# Patient Record
Sex: Female | Born: 1945 | Race: White | Hispanic: No | Smoking: Former smoker
Health system: Southern US, Community
[De-identification: ages and names within clinical notes are randomized; demographics above are authoritative.]

## PROBLEM LIST (undated history)

## (undated) DIAGNOSIS — G709 Myoneural disorder, unspecified: Secondary | ICD-10-CM

## (undated) DIAGNOSIS — N189 Chronic kidney disease, unspecified: Secondary | ICD-10-CM

## (undated) DIAGNOSIS — IMO0001 Reserved for inherently not codable concepts without codable children: Secondary | ICD-10-CM

## (undated) DIAGNOSIS — L409 Psoriasis, unspecified: Secondary | ICD-10-CM

## (undated) DIAGNOSIS — Z8489 Family history of other specified conditions: Secondary | ICD-10-CM

## (undated) DIAGNOSIS — R011 Cardiac murmur, unspecified: Secondary | ICD-10-CM

## (undated) DIAGNOSIS — J302 Other seasonal allergic rhinitis: Secondary | ICD-10-CM

## (undated) DIAGNOSIS — I10 Essential (primary) hypertension: Secondary | ICD-10-CM

## (undated) DIAGNOSIS — K219 Gastro-esophageal reflux disease without esophagitis: Secondary | ICD-10-CM

## (undated) DIAGNOSIS — I639 Cerebral infarction, unspecified: Secondary | ICD-10-CM

## (undated) DIAGNOSIS — T4145XA Adverse effect of unspecified anesthetic, initial encounter: Secondary | ICD-10-CM

## (undated) DIAGNOSIS — T8859XA Other complications of anesthesia, initial encounter: Secondary | ICD-10-CM

## (undated) DIAGNOSIS — M1990 Unspecified osteoarthritis, unspecified site: Secondary | ICD-10-CM

## (undated) DIAGNOSIS — C801 Malignant (primary) neoplasm, unspecified: Secondary | ICD-10-CM

## (undated) DIAGNOSIS — Z87442 Personal history of urinary calculi: Secondary | ICD-10-CM

## (undated) DIAGNOSIS — M797 Fibromyalgia: Secondary | ICD-10-CM

## (undated) DIAGNOSIS — Z5189 Encounter for other specified aftercare: Secondary | ICD-10-CM

## (undated) HISTORY — PX: EYE SURGERY: SHX253

## (undated) HISTORY — PX: JOINT REPLACEMENT: SHX530

## (undated) HISTORY — PX: CYST EXCISION: SHX5701

## (undated) HISTORY — PX: OVARIAN CYST REMOVAL: SHX89

## (undated) HISTORY — PX: CERVICAL FUSION: SHX112

## (undated) HISTORY — PX: TONSILLECTOMY: SUR1361

## (undated) HISTORY — PX: TRIGGER FINGER RELEASE: SHX641

## (undated) HISTORY — PX: COLON SURGERY: SHX602

## (undated) HISTORY — PX: ABDOMINAL HYSTERECTOMY: SHX81

## (undated) HISTORY — PX: APPENDECTOMY: SHX54

---

## 2003-11-04 ENCOUNTER — Other Ambulatory Visit: Payer: Self-pay

## 2003-11-07 ENCOUNTER — Inpatient Hospital Stay (HOSPITAL_COMMUNITY)
Admission: RE | Admit: 2003-11-07 | Discharge: 2003-11-15 | Payer: Self-pay | Admitting: Physical Medicine & Rehabilitation

## 2003-12-23 ENCOUNTER — Encounter
Admission: RE | Admit: 2003-12-23 | Discharge: 2004-03-22 | Payer: Self-pay | Admitting: Physical Medicine & Rehabilitation

## 2004-07-08 ENCOUNTER — Ambulatory Visit: Payer: Self-pay | Admitting: Anesthesiology

## 2004-07-30 ENCOUNTER — Ambulatory Visit: Payer: Self-pay | Admitting: Anesthesiology

## 2004-08-31 ENCOUNTER — Ambulatory Visit: Payer: Self-pay | Admitting: Anesthesiology

## 2004-10-19 ENCOUNTER — Ambulatory Visit: Payer: Self-pay | Admitting: Anesthesiology

## 2004-11-10 ENCOUNTER — Ambulatory Visit: Payer: Self-pay | Admitting: Anesthesiology

## 2004-12-07 ENCOUNTER — Ambulatory Visit: Payer: Self-pay | Admitting: Anesthesiology

## 2004-12-30 ENCOUNTER — Ambulatory Visit: Payer: Self-pay | Admitting: Anesthesiology

## 2005-01-19 ENCOUNTER — Ambulatory Visit: Payer: Self-pay | Admitting: Internal Medicine

## 2005-01-28 ENCOUNTER — Ambulatory Visit: Payer: Self-pay | Admitting: Anesthesiology

## 2006-02-24 ENCOUNTER — Ambulatory Visit: Payer: Self-pay | Admitting: Anesthesiology

## 2006-03-31 ENCOUNTER — Ambulatory Visit: Payer: Self-pay | Admitting: Anesthesiology

## 2006-06-01 ENCOUNTER — Ambulatory Visit: Payer: Self-pay | Admitting: Anesthesiology

## 2006-07-11 ENCOUNTER — Ambulatory Visit: Payer: Self-pay | Admitting: Anesthesiology

## 2006-10-27 ENCOUNTER — Ambulatory Visit: Payer: Self-pay | Admitting: Anesthesiology

## 2006-12-15 ENCOUNTER — Ambulatory Visit: Payer: Self-pay | Admitting: Anesthesiology

## 2006-12-22 ENCOUNTER — Ambulatory Visit: Payer: Self-pay | Admitting: Internal Medicine

## 2007-02-09 ENCOUNTER — Ambulatory Visit: Payer: Self-pay | Admitting: Anesthesiology

## 2007-03-14 ENCOUNTER — Ambulatory Visit: Payer: Self-pay | Admitting: Anesthesiology

## 2007-04-18 ENCOUNTER — Ambulatory Visit: Payer: Self-pay | Admitting: Anesthesiology

## 2007-06-27 ENCOUNTER — Ambulatory Visit: Payer: Self-pay | Admitting: Anesthesiology

## 2007-08-28 ENCOUNTER — Ambulatory Visit: Payer: Self-pay | Admitting: Anesthesiology

## 2007-10-20 ENCOUNTER — Ambulatory Visit: Payer: Self-pay | Admitting: Anesthesiology

## 2008-01-10 ENCOUNTER — Ambulatory Visit: Payer: Self-pay | Admitting: Anesthesiology

## 2008-01-17 ENCOUNTER — Ambulatory Visit: Payer: Self-pay | Admitting: Pain Medicine

## 2008-01-31 ENCOUNTER — Ambulatory Visit: Payer: Self-pay | Admitting: Pain Medicine

## 2008-02-08 ENCOUNTER — Ambulatory Visit: Payer: Self-pay | Admitting: Internal Medicine

## 2008-02-26 ENCOUNTER — Encounter: Payer: Self-pay | Admitting: Pain Medicine

## 2008-02-28 ENCOUNTER — Ambulatory Visit: Payer: Self-pay | Admitting: Pain Medicine

## 2008-03-04 ENCOUNTER — Encounter: Payer: Self-pay | Admitting: Pain Medicine

## 2008-03-05 ENCOUNTER — Ambulatory Visit: Payer: Self-pay | Admitting: Pain Medicine

## 2008-03-13 ENCOUNTER — Ambulatory Visit: Payer: Self-pay | Admitting: Pain Medicine

## 2008-04-03 ENCOUNTER — Encounter: Payer: Self-pay | Admitting: Pain Medicine

## 2008-04-03 ENCOUNTER — Ambulatory Visit: Payer: Self-pay | Admitting: Pain Medicine

## 2008-05-04 ENCOUNTER — Ambulatory Visit: Payer: Self-pay | Admitting: Pain Medicine

## 2008-07-24 ENCOUNTER — Ambulatory Visit: Payer: Self-pay | Admitting: Physician Assistant

## 2008-10-17 ENCOUNTER — Ambulatory Visit: Payer: Self-pay | Admitting: Physician Assistant

## 2009-02-04 ENCOUNTER — Ambulatory Visit: Payer: Self-pay | Admitting: Physician Assistant

## 2009-02-10 ENCOUNTER — Ambulatory Visit: Payer: Self-pay | Admitting: Internal Medicine

## 2009-05-06 ENCOUNTER — Ambulatory Visit: Payer: Self-pay | Admitting: Physician Assistant

## 2009-06-04 ENCOUNTER — Ambulatory Visit: Payer: Self-pay | Admitting: Physician Assistant

## 2009-06-24 ENCOUNTER — Ambulatory Visit: Payer: Self-pay | Admitting: Pain Medicine

## 2009-07-10 ENCOUNTER — Ambulatory Visit: Payer: Self-pay | Admitting: Physician Assistant

## 2009-10-02 ENCOUNTER — Ambulatory Visit: Payer: Self-pay | Admitting: Physician Assistant

## 2009-10-20 ENCOUNTER — Ambulatory Visit: Payer: Self-pay | Admitting: Pain Medicine

## 2009-10-21 ENCOUNTER — Ambulatory Visit: Payer: Self-pay | Admitting: Pain Medicine

## 2009-11-12 ENCOUNTER — Ambulatory Visit: Payer: Self-pay | Admitting: Pain Medicine

## 2009-11-25 ENCOUNTER — Ambulatory Visit: Payer: Self-pay | Admitting: Pain Medicine

## 2010-02-11 ENCOUNTER — Ambulatory Visit: Payer: Self-pay | Admitting: Internal Medicine

## 2010-05-14 ENCOUNTER — Ambulatory Visit: Payer: Self-pay

## 2010-05-18 ENCOUNTER — Ambulatory Visit: Payer: Self-pay | Admitting: Pain Medicine

## 2011-02-15 ENCOUNTER — Ambulatory Visit: Payer: Self-pay | Admitting: Internal Medicine

## 2011-08-17 ENCOUNTER — Encounter (HOSPITAL_COMMUNITY): Payer: Self-pay | Admitting: Pharmacy Technician

## 2011-08-18 ENCOUNTER — Encounter (HOSPITAL_COMMUNITY)
Admission: RE | Admit: 2011-08-18 | Discharge: 2011-08-18 | Disposition: A | Discharge status: Home / Self Care | Payer: Medicare Other | Source: Ambulatory Visit | Attending: Orthopedic Surgery | Admitting: Orthopedic Surgery

## 2011-08-18 ENCOUNTER — Encounter (HOSPITAL_COMMUNITY): Payer: Self-pay

## 2011-08-18 ENCOUNTER — Encounter (HOSPITAL_COMMUNITY)
Admission: RE | Admit: 2011-08-18 | Discharge: 2011-08-18 | Disposition: A | Discharge status: Home / Self Care | Payer: Medicare Other | Source: Ambulatory Visit | Attending: Anesthesiology | Admitting: Anesthesiology

## 2011-08-18 HISTORY — DX: Fibromyalgia: M79.7

## 2011-08-18 HISTORY — DX: Myoneural disorder, unspecified: G70.9

## 2011-08-18 HISTORY — DX: Adverse effect of unspecified anesthetic, initial encounter: T41.45XA

## 2011-08-18 HISTORY — DX: Cardiac murmur, unspecified: R01.1

## 2011-08-18 HISTORY — DX: Essential (primary) hypertension: I10

## 2011-08-18 HISTORY — DX: Chronic kidney disease, unspecified: N18.9

## 2011-08-18 HISTORY — DX: Encounter for other specified aftercare: Z51.89

## 2011-08-18 HISTORY — DX: Other complications of anesthesia, initial encounter: T88.59XA

## 2011-08-18 HISTORY — DX: Reserved for inherently not codable concepts without codable children: IMO0001

## 2011-08-18 HISTORY — DX: Unspecified osteoarthritis, unspecified site: M19.90

## 2011-08-18 HISTORY — DX: Cerebral infarction, unspecified: I63.9

## 2011-08-18 LAB — SURGICAL PCR SCREEN
MRSA, PCR: NEGATIVE
Staphylococcus aureus: NEGATIVE

## 2011-08-18 LAB — CBC
HCT: 38.9 % (ref 36.0–46.0)
Hemoglobin: 13.4 g/dL (ref 12.0–15.0)
MCH: 32.3 pg (ref 26.0–34.0)
MCHC: 34.4 g/dL (ref 30.0–36.0)
MCV: 93.7 fL (ref 78.0–100.0)
Platelets: 323 10*3/uL (ref 150–400)
RBC: 4.15 MIL/uL (ref 3.87–5.11)
RDW: 13.6 % (ref 11.5–15.5)
WBC: 15.1 10*3/uL — ABNORMAL HIGH (ref 4.0–10.5)

## 2011-08-18 LAB — BASIC METABOLIC PANEL
BUN: 22 mg/dL (ref 6–23)
CO2: 27 mEq/L (ref 19–32)
Calcium: 10.3 mg/dL (ref 8.4–10.5)
Chloride: 97 mEq/L (ref 96–112)
Creatinine, Ser: 0.66 mg/dL (ref 0.50–1.10)
GFR calc Af Amer: >90 mL/min (ref >90)
GFR calc non Af Amer: >90 mL/min (ref >90)
Glucose, Bld: 95 mg/dL (ref 70–99)
Potassium: 5.5 mEq/L — ABNORMAL HIGH (ref 3.5–5.1)
Sodium: 136 mEq/L (ref 135–145)

## 2011-08-18 NOTE — Progress Notes (Signed)
Spoke with Ceserine in [employer] she has been informed about LATEX allergy

## 2011-08-18 NOTE — Progress Notes (Signed)
Call to Dr. Shon Baton office, for preop orders

## 2011-08-18 NOTE — Progress Notes (Signed)
Call to [facility] for last ekg & ov note

## 2011-08-18 NOTE — Pre-Procedure Instructions (Signed)
20 Annette Mccullough  08/18/2011   Your procedure is scheduled on:  08/25/2011  Report to [facility] at 6:30 AM.  Call this number if you have problems the morning of surgery: [phone]   Remember:   Do not eat food:After Midnight.  Do not drink clear liquids: 4 Hours before arrival.  Take these medicines the morning of surgery with A SIP OF WATER: cymbalta, cyclobenzaprine, pain medicine if needed   Do not wear jewelry, make-up or nail polish.  Do not wear lotions, powders, or perfumes. You may wear deodorant.  Do not shave 48 hours prior to surgery.  Do not bring valuables to the hospital.  Contacts, dentures or bridgework may not be worn into surgery.  Leave suitcase in the car. After surgery it may be brought to your room.  For patients admitted to the hospital, checkout time is 11:00 AM the day of discharge.   Patients discharged the day of surgery will not be allowed to drive home.  Name and phone number of your driver:   Special Instructions: CHG Shower Use Special Wash: 1/2 bottle night before surgery and 1/2 bottle morning of surgery.   Please read over the following fact sheets that you were given: Pain Booklet, Coughing and Deep Breathing, MRSA Information and Surgical Site Infection Prevention

## 2011-08-24 MED ORDER — CEFAZOLIN SODIUM-DEXTROSE 2-3 GM-% IV SOLR
2.0000 g | INTRAVENOUS | Status: AC
  Administered 2011-08-25: 2 g via INTRAVENOUS
  Filled 2011-08-24: qty 50

## 2011-08-25 ENCOUNTER — Ambulatory Visit (HOSPITAL_COMMUNITY): Payer: Medicare Other

## 2011-08-25 ENCOUNTER — Ambulatory Visit (HOSPITAL_COMMUNITY)
Admission: RE | Admit: 2011-08-25 | Discharge: 2011-08-26 | DRG: 030 | Disposition: A | Discharge status: Home / Self Care | Payer: Medicare Other | Source: Ambulatory Visit | Attending: Orthopedic Surgery | Admitting: Orthopedic Surgery

## 2011-08-25 ENCOUNTER — Encounter (HOSPITAL_COMMUNITY)
Admission: RE | Disposition: A | Discharge status: Home / Self Care | Payer: Self-pay | Source: Ambulatory Visit | Attending: Orthopedic Surgery

## 2011-08-25 ENCOUNTER — Ambulatory Visit (HOSPITAL_COMMUNITY): Payer: Medicare Other | Admitting: Anesthesiology

## 2011-08-25 ENCOUNTER — Encounter (HOSPITAL_COMMUNITY): Payer: Self-pay | Admitting: Orthopedic Surgery

## 2011-08-25 ENCOUNTER — Encounter (HOSPITAL_COMMUNITY): Payer: Self-pay | Admitting: Anesthesiology

## 2011-08-25 DIAGNOSIS — Z01812 Encounter for preprocedural laboratory examination: Secondary | ICD-10-CM | POA: Insufficient documentation

## 2011-08-25 DIAGNOSIS — Z8673 Personal history of transient ischemic attack (TIA), and cerebral infarction without residual deficits: Secondary | ICD-10-CM | POA: Insufficient documentation

## 2011-08-25 DIAGNOSIS — G8929 Other chronic pain: Secondary | ICD-10-CM | POA: Diagnosis present

## 2011-08-25 DIAGNOSIS — I10 Essential (primary) hypertension: Secondary | ICD-10-CM | POA: Diagnosis present

## 2011-08-25 DIAGNOSIS — M79609 Pain in unspecified limb: Secondary | ICD-10-CM | POA: Insufficient documentation

## 2011-08-25 DIAGNOSIS — Z01818 Encounter for other preprocedural examination: Secondary | ICD-10-CM | POA: Insufficient documentation

## 2011-08-25 HISTORY — PX: SPINAL CORD STIMULATOR INSERTION: SHX5378

## 2011-08-25 LAB — URINALYSIS, MICROSCOPIC ONLY
Bilirubin Urine: NEGATIVE
Glucose, UA: NEGATIVE mg/dL
Hgb urine dipstick: NEGATIVE
Ketones, ur: NEGATIVE mg/dL
Leukocytes, UA: NEGATIVE
Nitrite: NEGATIVE
Protein, ur: NEGATIVE mg/dL
Specific Gravity, Urine: 1.027 (ref 1.005–1.030)
Urobilinogen, UA: 0.2 mg/dL (ref 0.0–1.0)
pH: 5.5 (ref 5.0–8.0)

## 2011-08-25 LAB — POCT I-STAT 4, (NA,K, GLUC, HGB,HCT)
Glucose, Bld: 98 mg/dL (ref 70–99)
HCT: 38 % (ref 36.0–46.0)
Hemoglobin: 12.9 g/dL (ref 12.0–15.0)
Potassium: 3.6 mEq/L (ref 3.5–5.1)
Sodium: 141 mEq/L (ref 135–145)

## 2011-08-25 LAB — GLUCOSE, CAPILLARY: Glucose-Capillary: 100 mg/dL — ABNORMAL HIGH (ref 70–99)

## 2011-08-25 SURGERY — INSERTION, SPINAL CORD STIMULATOR, LUMBAR
Anesthesia: General | Site: Back | Wound class: Clean

## 2011-08-25 MED ORDER — ACETAMINOPHEN 10 MG/ML IV SOLN
INTRAVENOUS | Status: AC
  Filled 2011-08-25: qty 100

## 2011-08-25 MED ORDER — ENALAPRIL MALEATE 20 MG PO TABS
20.0000 mg | ORAL_TABLET | Freq: Two times a day (BID) | ORAL | Status: DC
  Administered 2011-08-25 – 2011-08-26 (×2): 20 mg via ORAL
  Filled 2011-08-25 (×3): qty 1

## 2011-08-25 MED ORDER — DEXAMETHASONE 4 MG PO TABS
4.0000 mg | ORAL_TABLET | Freq: Four times a day (QID) | ORAL | Status: DC
  Administered 2011-08-26: 4 mg via ORAL
  Filled 2011-08-25 (×7): qty 1

## 2011-08-25 MED ORDER — METOPROLOL TARTRATE 25 MG PO TABS
25.0000 mg | ORAL_TABLET | Freq: Every day | ORAL | Status: DC
  Administered 2011-08-25 – 2011-08-26 (×2): 25 mg via ORAL
  Filled 2011-08-25 (×2): qty 1

## 2011-08-25 MED ORDER — DEXAMETHASONE SODIUM PHOSPHATE 4 MG/ML IJ SOLN
4.0000 mg | Freq: Four times a day (QID) | INTRAMUSCULAR | Status: DC
  Administered 2011-08-25 – 2011-08-26 (×3): 4 mg via INTRAVENOUS
  Filled 2011-08-25 (×7): qty 1

## 2011-08-25 MED ORDER — METOPROLOL TARTRATE 50 MG PO TABS
ORAL_TABLET | ORAL | Status: AC
  Administered 2011-08-25: 25 mg via ORAL
  Filled 2011-08-25: qty 1

## 2011-08-25 MED ORDER — MAGNESIUM HYDROXIDE 400 MG/5ML PO SUSP: 30.0000 mL | Freq: Two times a day (BID) | ORAL | Status: DC | PRN

## 2011-08-25 MED ORDER — ONDANSETRON HCL 4 MG/2ML IJ SOLN: 4.0000 mg | INTRAMUSCULAR | Status: DC | PRN

## 2011-08-25 MED ORDER — MENTHOL 3 MG MT LOZG: 1.0000 | LOZENGE | OROMUCOSAL | Status: DC | PRN

## 2011-08-25 MED ORDER — HYDROMORPHONE HCL PF 1 MG/ML IJ SOLN
0.2500 mg | INTRAMUSCULAR | Status: DC | PRN
  Administered 2011-08-25 (×3): 0.5 mg via INTRAVENOUS

## 2011-08-25 MED ORDER — CEFAZOLIN SODIUM 1-5 GM-% IV SOLN
1.0000 g | Freq: Three times a day (TID) | INTRAVENOUS | Status: AC
  Administered 2011-08-25 – 2011-08-26 (×2): 1 g via INTRAVENOUS
  Filled 2011-08-25 (×2): qty 50

## 2011-08-25 MED ORDER — LACTATED RINGERS IV SOLN
INTRAVENOUS | Status: DC
  Administered 2011-08-25: 16:00:00 via INTRAVENOUS

## 2011-08-25 MED ORDER — NEOSTIGMINE METHYLSULFATE 1 MG/ML IJ SOLN
INTRAMUSCULAR | Status: DC | PRN
  Administered 2011-08-25: 5 mg via INTRAVENOUS

## 2011-08-25 MED ORDER — SODIUM CHLORIDE 0.9 % IJ SOLN: 3.0000 mL | INTRAMUSCULAR | Status: DC | PRN

## 2011-08-25 MED ORDER — FENTANYL CITRATE 0.05 MG/ML IJ SOLN
INTRAMUSCULAR | Status: DC | PRN
  Administered 2011-08-25 (×3): 50 ug via INTRAVENOUS
  Administered 2011-08-25: 150 ug via INTRAVENOUS
  Administered 2011-08-25: 50 ug via INTRAVENOUS

## 2011-08-25 MED ORDER — HYDROCHLOROTHIAZIDE 25 MG PO TABS
25.0000 mg | ORAL_TABLET | Freq: Every day | ORAL | Status: DC
  Administered 2011-08-26: 25 mg via ORAL
  Filled 2011-08-25 (×2): qty 1

## 2011-08-25 MED ORDER — ONDANSETRON HCL 4 MG/2ML IJ SOLN
INTRAMUSCULAR | Status: DC | PRN
  Administered 2011-08-25: 4 mg via INTRAVENOUS

## 2011-08-25 MED ORDER — HYDROMORPHONE HCL PF 1 MG/ML IJ SOLN
INTRAMUSCULAR | Status: AC
  Filled 2011-08-25: qty 1

## 2011-08-25 MED ORDER — MORPHINE SULFATE 2 MG/ML IJ SOLN
1.0000 mg | INTRAMUSCULAR | Status: DC | PRN
  Administered 2011-08-25: 2 mg via INTRAVENOUS
  Filled 2011-08-25: qty 1

## 2011-08-25 MED ORDER — MIDAZOLAM HCL 5 MG/5ML IJ SOLN
INTRAMUSCULAR | Status: DC | PRN
  Administered 2011-08-25 (×2): 1 mg via INTRAVENOUS

## 2011-08-25 MED ORDER — ONDANSETRON HCL 4 MG PO TABS: 4.0000 mg | ORAL_TABLET | Freq: Three times a day (TID) | ORAL | Status: AC | PRN

## 2011-08-25 MED ORDER — PHENOL 1.4 % MT LIQD: 1.0000 | OROMUCOSAL | Status: DC | PRN

## 2011-08-25 MED ORDER — GLYCOPYRROLATE 0.2 MG/ML IJ SOLN
INTRAMUSCULAR | Status: DC | PRN
  Administered 2011-08-25: 1 mg via INTRAVENOUS

## 2011-08-25 MED ORDER — LACTATED RINGERS IV SOLN
INTRAVENOUS | Status: DC | PRN
  Administered 2011-08-25 (×3): via INTRAVENOUS

## 2011-08-25 MED ORDER — DULOXETINE HCL 60 MG PO CPEP
60.0000 mg | ORAL_CAPSULE | Freq: Every day | ORAL | Status: DC
  Administered 2011-08-26: 60 mg via ORAL
  Filled 2011-08-25 (×2): qty 1

## 2011-08-25 MED ORDER — PROMETHAZINE HCL 25 MG/ML IJ SOLN: 6.2500 mg | INTRAMUSCULAR | Status: DC | PRN

## 2011-08-25 MED ORDER — BUPIVACAINE-EPINEPHRINE 0.25% -1:200000 IJ SOLN
INTRAMUSCULAR | Status: DC | PRN
  Administered 2011-08-25: 10 mL

## 2011-08-25 MED ORDER — DOCUSATE SODIUM 100 MG PO CAPS
100.0000 mg | ORAL_CAPSULE | Freq: Two times a day (BID) | ORAL | Status: DC
  Administered 2011-08-25 – 2011-08-26 (×2): 100 mg via ORAL
  Filled 2011-08-25 (×3): qty 1

## 2011-08-25 MED ORDER — MEPERIDINE HCL 25 MG/ML IJ SOLN: 6.2500 mg | INTRAMUSCULAR | Status: DC | PRN

## 2011-08-25 MED ORDER — SODIUM CHLORIDE 0.9 % IJ SOLN
3.0000 mL | Freq: Two times a day (BID) | INTRAMUSCULAR | Status: DC
  Administered 2011-08-25: 3 mL via INTRAVENOUS

## 2011-08-25 MED ORDER — SURGIFOAM 100 EX MISC
CUTANEOUS | Status: DC | PRN
  Administered 2011-08-25: 10:00:00 via TOPICAL

## 2011-08-25 MED ORDER — ACETAMINOPHEN 10 MG/ML IV SOLN
1000.0000 mg | Freq: Once | INTRAVENOUS | Status: AC
  Administered 2011-08-25 (×2): 1000 mg via INTRAVENOUS
  Filled 2011-08-25: qty 100

## 2011-08-25 MED ORDER — POLYETHYLENE GLYCOL 3350 17 G PO PACK: 17.0000 g | PACK | Freq: Every day | ORAL | Status: AC

## 2011-08-25 MED ORDER — MORPHINE SULFATE 2 MG/ML IJ SOLN: 0.0500 mg/kg | INTRAMUSCULAR | Status: DC | PRN

## 2011-08-25 MED ORDER — POLYETHYLENE GLYCOL 3350 17 G PO PACK: 17.0000 g | PACK | Freq: Every day | ORAL | Status: DC | PRN

## 2011-08-25 MED ORDER — ACETAMINOPHEN 10 MG/ML IV SOLN
1000.0000 mg | Freq: Four times a day (QID) | INTRAVENOUS | Status: DC
  Administered 2011-08-25 – 2011-08-26 (×3): 1000 mg via INTRAVENOUS
  Filled 2011-08-25 (×4): qty 100

## 2011-08-25 MED ORDER — HETASTARCH-ELECTROLYTES 6 % IV SOLN
INTRAVENOUS | Status: DC | PRN
  Administered 2011-08-25: 10:00:00 via INTRAVENOUS

## 2011-08-25 MED ORDER — CYCLOBENZAPRINE HCL 10 MG PO TABS
10.0000 mg | ORAL_TABLET | Freq: Two times a day (BID) | ORAL | Status: DC | PRN
  Administered 2011-08-25: 10 mg via ORAL
  Filled 2011-08-25: qty 1

## 2011-08-25 MED ORDER — PROPOFOL 10 MG/ML IV EMUL
INTRAVENOUS | Status: DC | PRN
  Administered 2011-08-25: 180 mg via INTRAVENOUS

## 2011-08-25 MED ORDER — ROCURONIUM BROMIDE 100 MG/10ML IV SOLN
INTRAVENOUS | Status: DC | PRN
  Administered 2011-08-25: 50 mg via INTRAVENOUS
  Administered 2011-08-25 (×5): 10 mg via INTRAVENOUS

## 2011-08-25 MED ORDER — METOPROLOL TARTRATE 12.5 MG HALF TABLET: 25.0000 mg | ORAL_TABLET | Freq: Once | ORAL | Status: DC

## 2011-08-25 MED ORDER — MIDAZOLAM HCL 2 MG/2ML IJ SOLN: 0.5000 mg | Freq: Once | INTRAMUSCULAR | Status: DC | PRN

## 2011-08-25 MED ORDER — EPHEDRINE SULFATE 50 MG/ML IJ SOLN
INTRAMUSCULAR | Status: DC | PRN
  Administered 2011-08-25: 5 mg via INTRAVENOUS

## 2011-08-25 MED ORDER — SODIUM CHLORIDE 0.9 % IV SOLN: 250.0000 mL | INTRAVENOUS | Status: DC

## 2011-08-25 MED ORDER — ZOLPIDEM TARTRATE 10 MG PO TABS: 10.0000 mg | ORAL_TABLET | Freq: Every evening | ORAL | Status: DC | PRN

## 2011-08-25 MED ORDER — OXYCODONE HCL 5 MG PO TABS
10.0000 mg | ORAL_TABLET | ORAL | Status: DC | PRN
  Administered 2011-08-25 – 2011-08-26 (×3): 10 mg via ORAL
  Filled 2011-08-25 (×3): qty 2

## 2011-08-25 MED ORDER — CHLORHEXIDINE GLUCONATE 4 % EX LIQD: 60.0000 mL | Freq: Once | CUTANEOUS | Status: DC

## 2011-08-25 SURGICAL SUPPLY — 62 items
BAG ISOLATION DRAPE 18X18 (DRAPES) ×1 IMPLANT
CANISTER SUCTION 2500CC (MISCELLANEOUS) IMPLANT
CHANNEL EON MINI 16 IPG (Orthopedic Implant) ×2 IMPLANT
CLOSURE STERI STRIP 1/2 X4 (GAUZE/BANDAGES/DRESSINGS) ×2 IMPLANT
CLOTH BEACON ORANGE TIMEOUT ST (SAFETY) ×2 IMPLANT
CORDS BIPOLAR (ELECTRODE) ×2 IMPLANT
DERMABOND ADVANCED (GAUZE/BANDAGES/DRESSINGS)
DERMABOND ADVANCED .7 DNX12 (GAUZE/BANDAGES/DRESSINGS) IMPLANT
DRAPE C-ARM 42X72 X-RAY (DRAPES) ×2 IMPLANT
DRAPE INCISE IOBAN 85X60 (DRAPES) ×2 IMPLANT
DRAPE ISOLATION BAG 18X18 (DRAPES) ×1
DRAPE SURG 17X23 STRL (DRAPES) ×2 IMPLANT
DRAPE U-SHAPE 47X51 STRL (DRAPES) ×4 IMPLANT
DRSG MEPILEX BORDER 4X4 (GAUZE/BANDAGES/DRESSINGS) IMPLANT
DURAPREP 26ML APPLICATOR (WOUND CARE) ×2 IMPLANT
ELECT CAUTERY BLADE 6.4 (BLADE) ×2 IMPLANT
ELECT REM PT RETURN 9FT ADLT (ELECTROSURGICAL) ×2
ELECTRODE REM PT RTRN 9FT ADLT (ELECTROSURGICAL) ×1 IMPLANT
GAUZE SPONGE 4X4 12PLY STRL LF (GAUZE/BANDAGES/DRESSINGS) ×2 IMPLANT
GLOVE BIOGEL PI IND STRL 6.5 (GLOVE) ×1 IMPLANT
GLOVE BIOGEL PI IND STRL 8.5 (GLOVE) ×1 IMPLANT
GLOVE BIOGEL PI INDICATOR 6.5 (GLOVE) ×1
GLOVE BIOGEL PI INDICATOR 8.5 (GLOVE) ×1
GLOVE ECLIPSE 6.0 STRL STRAW (GLOVE) ×2 IMPLANT
GLOVE ECLIPSE 8.5 STRL (GLOVE) IMPLANT
GOWN PREVENTION PLUS XXLARGE (GOWN DISPOSABLE) ×4 IMPLANT
GOWN STRL NON-REIN LRG LVL3 (GOWN DISPOSABLE) ×4 IMPLANT
KIT BASIN OR (CUSTOM PROCEDURE TRAY) ×2 IMPLANT
KIT ROOM TURNOVER OR (KITS) ×2 IMPLANT
Lamitrode s-8 Lead kit, 60cm length ×4 IMPLANT
NDL SUT 6 .5 CRC .975X.05 MAYO (NEEDLE) ×1 IMPLANT
NEEDLE 22X1 1/2 (OR ONLY) (NEEDLE) ×2 IMPLANT
NEEDLE MAYO TAPER (NEEDLE) ×1
NEEDLE SPNL 18GX3.5 QUINCKE PK (NEEDLE) ×4 IMPLANT
NS IRRIG 1000ML POUR BTL (IV SOLUTION) ×2 IMPLANT
PACK LAMINECTOMY ORTHO (CUSTOM PROCEDURE TRAY) ×2 IMPLANT
PACK UNIVERSAL I (CUSTOM PROCEDURE TRAY) ×2 IMPLANT
PAD ARMBOARD 7.5X6 YLW CONV (MISCELLANEOUS) ×2 IMPLANT
PROGRAMMER EON PATIENT (MISCELLANEOUS) ×2 IMPLANT
SPATULA SILICONE BRAIN 10MM (Orthopedic Implant) ×2 IMPLANT
SPONGE GAUZE 4X4 12PLY (GAUZE/BANDAGES/DRESSINGS) ×2 IMPLANT
SPONGE LAP 4X18 X RAY DECT (DISPOSABLE) ×2 IMPLANT
SPONGE SURGIFOAM ABS GEL 100 (HEMOSTASIS) ×2 IMPLANT
STAPLER VISISTAT 35W (STAPLE) ×2 IMPLANT
STRIP CLOSURE SKIN 1/2X4 (GAUZE/BANDAGES/DRESSINGS) ×2 IMPLANT
SURGIFLO TRUKIT (HEMOSTASIS) ×4 IMPLANT
SUT FIBERWIRE #2 38 REV NDL BL (SUTURE) ×2
SUT FIBERWIRE 2-0 18 17.9 3/8 (SUTURE) ×2
SUT MNCRL AB 3-0 PS2 18 (SUTURE) ×4 IMPLANT
SUT VIC AB 1 CT1 27 (SUTURE) ×3
SUT VIC AB 1 CT1 27XBRD ANBCTR (SUTURE) ×3 IMPLANT
SUT VIC AB 2-0 CT1 18 (SUTURE) ×2 IMPLANT
SUTURE FIBERWR 2-0 18 17.9 3/8 (SUTURE) ×1 IMPLANT
SUTURE FIBERWR#2 38 REV NDL BL (SUTURE) ×1 IMPLANT
SYR BULB IRRIGATION 50ML (SYRINGE) ×2 IMPLANT
SYR CONTROL 10ML LL (SYRINGE) ×2 IMPLANT
SYSTEM CHARGING EON MINI (Orthopedic Implant) ×2 IMPLANT
TAPE CLOTH SILK CARING 3INX10 (GAUZE/BANDAGES/DRESSINGS) ×2 IMPLANT
TOWEL OR 17X24 6PK STRL BLUE (TOWEL DISPOSABLE) ×2 IMPLANT
TOWEL OR 17X26 10 PK STRL BLUE (TOWEL DISPOSABLE) ×2 IMPLANT
TRAY FOLEY CATH 14FR (SET/KITS/TRAYS/PACK) IMPLANT
WATER STERILE IRR 1000ML POUR (IV SOLUTION) ×2 IMPLANT

## 2011-08-25 NOTE — Progress Notes (Signed)
Report given to sheryl ward rn as caregiver 

## 2011-08-25 NOTE — Anesthesia Postprocedure Evaluation (Signed)
  Anesthesia Post-op Note  Patient: Annette Mccullough  Procedure(s) Performed:  LUMBAR SPINAL CORD STIMULATOR INSERTION  Patient Location: PACU  Anesthesia Type: General  Level of Consciousness: awake  Airway and Oxygen Therapy: Patient Spontanous Breathing  Post-op Pain: mild  Post-op Assessment: Post-op Vital signs reviewed  Post-op Vital Signs: stable  Complications: No apparent anesthesia complications

## 2011-08-25 NOTE — Anesthesia Preprocedure Evaluation (Addendum)
Anesthesia Evaluation  Patient identified by MRN, date of birth, ID band Patient awake    Reviewed: Allergy & Precautions  Airway Mallampati: II      Dental  (+) Edentulous Upper   Pulmonary  clear to auscultation        Cardiovascular hypertension, Pt. on medications and Pt. on home beta blockers regular     Neuro/Psych CVA    GI/Hepatic   Endo/Other  Hypothyroidism   Renal/GU      Musculoskeletal  (+) Fibromyalgia -  Abdominal   Peds  Hematology   Anesthesia Other Findings   Reproductive/Obstetrics                          Anesthesia Physical Anesthesia Plan  ASA: III  Anesthesia Plan: General   Post-op Pain Management:    Induction: Intravenous  Airway Management Planned: Oral ETT  Additional Equipment:   Intra-op Plan:   Post-operative Plan: Extubation in OR  Informed Consent: I have reviewed the patients History and Physical, chart, labs and discussed the procedure including the risks, benefits and alternatives for the proposed anesthesia with the patient or authorized representative who has indicated his/her understanding and acceptance.   Dental advisory given  Plan Discussed with: CRNA  Anesthesia Plan Comments:         Anesthesia Quick Evaluation

## 2011-08-25 NOTE — Brief Op Note (Signed)
08/25/2011  1:17 PM  PATIENT:  Annette Mccullough  65 y.o. female  PRE-OPERATIVE DIAGNOSIS:  CHRONIC PAIN  POST-OPERATIVE DIAGNOSIS:  CHRONIC PAIN  PROCEDURE:  Procedure(s): LUMBAR SPINAL CORD STIMULATOR INSERTION  SURGEON:  Surgeon(s): Eltha Tingley D Norie Latendresse  PHYSICIAN ASSISTANT:   ASSISTANTS: Norval Gable   ANESTHESIA:   general  EBL:  Total I/O In: 2500 [I.V.:2000; IV Piggyback:500] Out: 325 [Urine:125; Blood:200]  BLOOD ADMINISTERED:none  DRAINS: none   LOCAL MEDICATIONS USED:  MARCAINE 10CC  SPECIMEN:  No Specimen  DISPOSITION OF SPECIMEN:  N/A  COUNTS:  YES  TOURNIQUET:  * No tourniquets in log *  DICTATION: .Dragon Dictation  PLAN OF CARE: Admit for overnight observation  PATIENT DISPOSITION:  PACU - hemodynamically stable.   Delay start of Pharmacological VTE agent (>24hrs) due to surgical blood loss or risk of bleeding:  {YES/NO/NOT APPLICABLE:20182

## 2011-08-25 NOTE — Transfer of Care (Signed)
Immediate Anesthesia Transfer of Care Note  Patient: Annette Mccullough  Procedure(s) Performed:  LUMBAR SPINAL CORD STIMULATOR INSERTION  Patient Location: PACU  Anesthesia Type: General  Level of Consciousness: awake, alert  and oriented  Airway & Oxygen Therapy: Patient Spontanous Breathing and Patient connected to nasal cannula oxygen  Post-op Assessment: Report given to PACU RN, Post -op Vital signs reviewed and stable and Patient moving all extremities  Post vital signs: Reviewed and stable  Complications: No apparent anesthesia complications

## 2011-08-25 NOTE — Preoperative (Addendum)
Beta Blockers   Reason not to administer Beta Blockers:Not Applicable 

## 2011-08-25 NOTE — Anesthesia Procedure Notes (Addendum)
Procedure Name: Intubation Date/Time: 08/25/2011 8:39 AM Performed by: Julianne Rice K Pre-anesthesia Checklist: Patient identified, Timeout performed, Emergency Drugs available and Suction available Patient Re-evaluated:Patient Re-evaluated prior to inductionOxygen Delivery Method: Circle System Utilized Preoxygenation: Pre-oxygenation with 100% oxygen Intubation Type: IV induction Ventilation: Mask ventilation without difficulty Laryngoscope Size: Mac and 3 Grade View: Grade II Tube type: Oral Tube size: 7.5 mm Number of attempts: 1 Airway Equipment and Method: patient positioned with wedge pillow and stylet Placement Confirmation: ETT inserted through vocal cords under direct vision,  positive ETCO2 and breath sounds checked- equal and bilateral Secured at: 21 cm Tube secured with: Tape Dental Injury: Teeth and Oropharynx as per pre-operative assessment     Performed by: Denelda Akerley K

## 2011-08-25 NOTE — H&P (Signed)
  No change in clinical history Patient re-examined  See office notes for details of H+P

## 2011-08-25 NOTE — Op Note (Signed)
OPERATIVE REPORT  DATE OF SURGERY: 08/25/2011  PATIENT NAME:  Annette Mccullough MRN: [id number] DOB: 1945-10-20  PCP: Conchita Paris, MD  PRE-OPERATIVE DIAGNOSIS:  Chronic pain  POST-OPERATIVE DIAGNOSIS:  Same  PROCEDURE:   Spinal cord stimulator implantation  SURGEON:  Venita Lick, MD  PHYSICIAN ASSISTANT: Norval Gable    ANESTHESIA:   General  EBL: 150 ml   BRIEF HISTORY: Annette Mccullough is a 65 y.o. female who presents to my office with complaints of severe debilitating bilateral leg pain. His to extensive conservative management he still has significant radicular this pain. She did have an excellent recovery. As a result of the positive result she presented to me for definitive implantation.  all appropriate risks benefits and of surgery were explained to the patient and consent was obtained.   PROCEDURE DETAILS: Patient was brought into the operating room. After successful induction of general anesthesia and tracheal intubation a Time Out was done. This confirmed all pertinent important data.  patient was turned prone onto the Wilson frame. All bony prominences were well padded. Xray was then brought into he'll to confirm that I would be able to counts identify the correct level. After this was confirmed the back was prepped and draped in a standard fashion.  Using unaccounted up from L5 L5-S1 junction and identified in the lateral plane the T9 vertebral body. I confirm this in the AP plane as well. Using utilizing the fact that the T12 with the last rib bearing vertebrae I was able to fire in both planes my appropriate the incision site. Midline the incision site itself was infiltrated with quarter percent Marcaine with epinephrine and I made a generous midline incision site was sharply dissected down to the thick adipose tissue to the deep fascia the deep fascia was sharply incised at gently mobilized paraspinal muscles to expose the spinous process and lamina of T9-T10 and  portion of T11. I again reconfirmed both the AP and lateral planes that I was at the appropriate level and then resected the majority of the T9 spinous process.  I then used a small nerve hook to develop a plane underneath the T9 lamina and performed a generous T9 laminotomy. His was completed with 2 and 3 mm Kerrison rongeurs. Care was taken not to irritate the thecal sac. Once I removed the generous portion of bone I then developed a plane between the ligamentum flavum and dura and resected the ligamentum flavum.  I then used a Public house manager to dissect through the epidural fat in order it visualize the dorsal surface of the thecal sac. Once identified this I then used a Public house manager to dissect underneath the lamina superiorly to a significant central adhesion which prohibited me from advancing the spatula. I took a great deal of time attempting to dissect through the central adhesion in order to place the larger of the spinal cord paddle after prolonged attempts elected to not use the regular standard paddle for fear of causing neurological trauma.  At this point I then took the long narrow stimulator and began to try advancing up to the superior portion of T8. Unfortunately he continued to be everted laterally because of the central scar. After multiple attempts of passing in the narrower implant I then performed a more complete a more generous laminotomy of T9.  I continued to have significant issues with the central scar. After multiple attempts I then performed a small T8 laminotomy. I can now visualize the thecal sac above and below the  T9 lamina side even with this amount of exposure I still could not pass the  device. At this point I elected to complete the T9 laminotomy.  With a complete T9 laminectomy I could now easily position the implant. Once again however at the T. 70 junction again there was a central scar which prevented me from being dead in the midline with the device. At this  point I then took the trial percutaneously and placed on the left-hand side of the thecal sac. At this point I now bilateral I had good central coverage appeared I then secured the leads to the T10 spinous process with the 2 FiberWire through bone holes then  I then made a second incision on the left gluteal region were at the mapped out and battery site I dissected down to half centimeters and created a pocket I then used the submuscular passing device to pass the wires from the thoracic incision to the left lower gluteal incision. I then connected the percutaneously into the lower portion of the battery and the right cleanly to the upper part of the it was then torqued down and sutured into place. I then tested the battery again confirm that the leads were functioning properly. Both wounds were copiously irrigated and then obtained hemostasis using bipolar electrocautery I then closed the deep fascia with interrupted #1 Vicryl suture and then did a running 0 Vicryl suture stitch for the deep fascia and then a 2-0 Vicryl suture for the subcutaneous from the superficial fat and then closed the skin with 3-0 Monocryl. Dry dressings were applied patient was extubated transferred to PACU then took final x-rays were satisfactory. At the end of the case all needle sponge counts were correct. This is Dr. Shon Baton indication    Venita Lick, MD 08/25/2011 1:07 PM

## 2011-08-26 LAB — URINE CULTURE
Colony Count: NO GROWTH
Culture  Setup Time: 201211210959
Culture: NO GROWTH

## 2011-08-26 NOTE — Progress Notes (Signed)
D/C instructions reviewed with patient and daughter. RX x 2 given. All questions answered. No hhservices or equipment needed. Pt d/c'ed via wheelchair in stable condition

## 2011-08-26 NOTE — Progress Notes (Signed)
Physical Therapy Evaluation Patient Details Name: Annette Mccullough MRN: [id number] DOB: August 11, 1946 Today's Date: 08/26/2011  Problem List:  Patient Active Problem List  Diagnoses  . Chronic pain  . Morbid obesity, BMI unknown  . Hypertension    Past Medical History:  Past Medical History  Diagnosis Date  . Complication of anesthesia     post anesthesia - swelling of lip- relative to tape  . Heart murmur     rec's antibiotic prior to dental work, never had any cardiac testing done  . Hypertension   . Hypothyroidism     R side cyst, will have f/u at a later date  . Blood transfusion     74- post childbirth & /w joint replacement   . Chronic kidney disease     renal calculi /w pregnancy  . Neuromuscular disorder     DDD- lumbar & cervical   . Arthritis     multiple areas  . Fibromyalgia   . Depression   . Stroke     1972 & 2005, 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- some weakness remains    Past Surgical History:  Past Surgical History  Procedure Date  . Abdominal hysterectomy   . Joint replacement     1999- R knee  . Cervical fusion     2011  . Cesarean section     2 times   . Tonsillectomy     as a child    PT Assessment/Plan/Recommendation PT Assessment PT Recommendation/Assessment: Patent does not need any further PT services No Skilled PT: Patient at baseline level of functioning;All education completed;Patient is modified independent with all activity/mobility PT Goals     PT Evaluation Precautions/Restrictions  Precautions Precautions: Back Prior Functioning  Home Living Lives With: Alone Type of Home: Apartment Home Layout: One level Home Access: Stairs to enter Entrance Stairs-Rails: Right Entrance Stairs-Number of Steps: 1 Bathroom Shower/Tub: Engineer, manufacturing systems: Standard Home Adaptive Equipment: Raised toilet seat with rails;Straight cane Prior Function Level of Independence: Needs assistance with homemaking;Independent with basic  ADLs;Independent with transfers;Requires assistive device for independence Light Housekeeping: Moderate Driving: Yes Vocation: Retired Producer, television/film/video: Awake/alert Overall Cognitive Status: Appears within functional limits for tasks assessed Orientation Level: Oriented X4 Sensation/Coordination   Extremity Assessment RLE Assessment RLE Assessment: Within Functional Limits LLE Assessment LLE Assessment: Within Functional Limits Mobility (including Balance) Bed Mobility Bed Mobility: Yes Rolling Left: 6: Modified independent (Device/Increase time) Right Sidelying to Sit: 6: Modified independent (Device/Increase time);HOB flat Transfers Transfers: Yes Sit to Stand: 6: Modified independent (Device/Increase time);From bed;From toilet Stand to Sit: 6: Modified independent (Device/Increase time);To chair/3-in-1;To toilet Ambulation/Gait Ambulation/Gait: Yes Ambulation/Gait Assistance: 6: Modified independent (Device/Increase time) Ambulation Distance (Feet): 200 Feet Assistive device: Straight cane Gait Pattern: Within Functional Limits Stairs: Yes Stairs Assistance: 6: Modified independent (Device/Increase time) Stair Management Technique: One rail Right;With cane Number of Stairs: 2  Height of Stairs: 8     Exercise    End of Session PT - End of Session Equipment Utilized During Treatment: Gait belt Activity Tolerance: Patient tolerated treatment well Patient left: in chair;with call bell in reach General Behavior During Session: [facility] for tasks performed Cognition: [facility] for tasks performed  Delorse Lek 08/26/2011, 9:12 AM  Toney Sang, PT [phone]

## 2011-08-31 ENCOUNTER — Encounter (HOSPITAL_COMMUNITY): Payer: Self-pay | Admitting: Orthopedic Surgery

## 2011-09-14 ENCOUNTER — Encounter (HOSPITAL_COMMUNITY): Payer: Self-pay

## 2011-09-14 NOTE — Progress Notes (Signed)
Contacted Dr. Shon Baton office, spoke with Toniann Fail and [place], requested orders for surgery on 09/15/11.  States [place], [place] will be entering orders.

## 2011-09-15 ENCOUNTER — Encounter (HOSPITAL_COMMUNITY): Payer: Self-pay | Admitting: Anesthesiology

## 2011-09-15 ENCOUNTER — Ambulatory Visit (HOSPITAL_COMMUNITY)
Admission: RE | Admit: 2011-09-15 | Discharge: 2011-09-16 | Disposition: A | Discharge status: Home / Self Care | Payer: Medicare Other | Source: Ambulatory Visit | Attending: Orthopedic Surgery | Admitting: Orthopedic Surgery

## 2011-09-15 ENCOUNTER — Encounter (HOSPITAL_COMMUNITY): Payer: Self-pay | Admitting: *Deleted

## 2011-09-15 ENCOUNTER — Encounter (HOSPITAL_COMMUNITY)
Admission: RE | Disposition: A | Discharge status: Home / Self Care | Payer: Self-pay | Source: Ambulatory Visit | Attending: Orthopedic Surgery

## 2011-09-15 ENCOUNTER — Other Ambulatory Visit: Payer: Self-pay

## 2011-09-15 ENCOUNTER — Ambulatory Visit (HOSPITAL_COMMUNITY): Payer: Medicare Other | Admitting: Anesthesiology

## 2011-09-15 DIAGNOSIS — T8130XA Disruption of wound, unspecified, initial encounter: Secondary | ICD-10-CM | POA: Insufficient documentation

## 2011-09-15 DIAGNOSIS — I10 Essential (primary) hypertension: Secondary | ICD-10-CM | POA: Insufficient documentation

## 2011-09-15 DIAGNOSIS — Y849 Medical procedure, unspecified as the cause of abnormal reaction of the patient, or of later complication, without mention of misadventure at the time of the procedure: Secondary | ICD-10-CM | POA: Insufficient documentation

## 2011-09-15 DIAGNOSIS — IMO0001 Reserved for inherently not codable concepts without codable children: Secondary | ICD-10-CM | POA: Insufficient documentation

## 2011-09-15 DIAGNOSIS — G8929 Other chronic pain: Secondary | ICD-10-CM

## 2011-09-15 HISTORY — PX: LUMBAR WOUND DEBRIDEMENT: SHX1988

## 2011-09-15 LAB — CBC
HCT: 34.9 % — ABNORMAL LOW (ref 36.0–46.0)
Hemoglobin: 11.7 g/dL — ABNORMAL LOW (ref 12.0–15.0)
MCH: 31.5 pg (ref 26.0–34.0)
MCHC: 33.5 g/dL (ref 30.0–36.0)
MCV: 94.1 fL (ref 78.0–100.0)
Platelets: 299 10*3/uL (ref 150–400)
RBC: 3.71 MIL/uL — ABNORMAL LOW (ref 3.87–5.11)
RDW: 13.5 % (ref 11.5–15.5)
WBC: 9.5 10*3/uL (ref 4.0–10.5)

## 2011-09-15 LAB — BASIC METABOLIC PANEL
BUN: 15 mg/dL (ref 6–23)
CO2: 29 mEq/L (ref 19–32)
Calcium: 10.1 mg/dL (ref 8.4–10.5)
Chloride: 102 mEq/L (ref 96–112)
Creatinine, Ser: 0.7 mg/dL (ref 0.50–1.10)
GFR calc Af Amer: >90 mL/min (ref >90)
GFR calc non Af Amer: 90 mL/min — ABNORMAL LOW (ref >90)
Glucose, Bld: 102 mg/dL — ABNORMAL HIGH (ref 70–99)
Potassium: 5 mEq/L (ref 3.5–5.1)
Sodium: 139 mEq/L (ref 135–145)

## 2011-09-15 LAB — SURGICAL PCR SCREEN
MRSA, PCR: NEGATIVE
Staphylococcus aureus: NEGATIVE

## 2011-09-15 SURGERY — LUMBAR WOUND DEBRIDEMENT
Anesthesia: General | Site: Back | Wound class: Clean

## 2011-09-15 MED ORDER — GLYCOPYRROLATE 0.2 MG/ML IJ SOLN
INTRAMUSCULAR | Status: DC | PRN
  Administered 2011-09-15: .6 mg via INTRAVENOUS

## 2011-09-15 MED ORDER — VANCOMYCIN HCL 1000 MG IV SOLR
1000.0000 mg | INTRAVENOUS | Status: DC
  Filled 2011-09-15: qty 1000

## 2011-09-15 MED ORDER — ALUM & MAG HYDROXIDE-SIMETH 400-400-40 MG/5ML PO SUSP
30.0000 mL | Freq: Four times a day (QID) | ORAL | Status: DC | PRN
  Filled 2011-09-15: qty 30

## 2011-09-15 MED ORDER — DIPHENHYDRAMINE HCL 25 MG PO CAPS
25.0000 mg | ORAL_CAPSULE | Freq: Four times a day (QID) | ORAL | Status: DC | PRN
  Administered 2011-09-15 – 2011-09-16 (×2): 25 mg via ORAL
  Filled 2011-09-15 (×2): qty 1

## 2011-09-15 MED ORDER — LACTATED RINGERS IV SOLN: INTRAVENOUS | Status: DC

## 2011-09-15 MED ORDER — PROPOFOL 10 MG/ML IV EMUL
INTRAVENOUS | Status: DC | PRN
  Administered 2011-09-15: 130 mg via INTRAVENOUS

## 2011-09-15 MED ORDER — CEFAZOLIN SODIUM-DEXTROSE 2-3 GM-% IV SOLR
2.0000 g | INTRAVENOUS | Status: AC
  Administered 2011-09-15: 2000 mg via INTRAVENOUS
  Filled 2011-09-15: qty 50

## 2011-09-15 MED ORDER — DULOXETINE HCL 60 MG PO CPEP
60.0000 mg | ORAL_CAPSULE | Freq: Every day | ORAL | Status: DC
  Administered 2011-09-16: 60 mg via ORAL
  Filled 2011-09-15 (×2): qty 1

## 2011-09-15 MED ORDER — HYDROMORPHONE HCL PF 2 MG/ML IJ SOLN: 2.0000 mg | INTRAMUSCULAR | Status: DC | PRN

## 2011-09-15 MED ORDER — SODIUM CHLORIDE 0.9 % IJ SOLN: 3.0000 mL | INTRAMUSCULAR | Status: DC | PRN

## 2011-09-15 MED ORDER — LIDOCAINE HCL (CARDIAC) 20 MG/ML IV SOLN
INTRAVENOUS | Status: DC | PRN
  Administered 2011-09-15: 80 mg via INTRAVENOUS

## 2011-09-15 MED ORDER — VANCOMYCIN HCL 1000 MG IV SOLR
INTRAVENOUS | Status: DC | PRN
  Administered 2011-09-15: 1000 mg via TOPICAL

## 2011-09-15 MED ORDER — ENALAPRIL MALEATE 20 MG PO TABS
20.0000 mg | ORAL_TABLET | Freq: Two times a day (BID) | ORAL | Status: DC
  Administered 2011-09-15 – 2011-09-16 (×2): 20 mg via ORAL
  Filled 2011-09-15 (×3): qty 1

## 2011-09-15 MED ORDER — MUPIROCIN 2 % EX OINT
TOPICAL_OINTMENT | CUTANEOUS | Status: AC
  Filled 2011-09-15: qty 22

## 2011-09-15 MED ORDER — SODIUM CHLORIDE 0.9 % IJ SOLN
3.0000 mL | Freq: Two times a day (BID) | INTRAMUSCULAR | Status: DC
  Administered 2011-09-15: 3 mL via INTRAVENOUS
  Administered 2011-09-16: 10:00:00 via INTRAVENOUS

## 2011-09-15 MED ORDER — METOPROLOL TARTRATE 25 MG PO TABS
25.0000 mg | ORAL_TABLET | Freq: Every day | ORAL | Status: DC
  Administered 2011-09-16: 25 mg via ORAL
  Filled 2011-09-15 (×2): qty 1

## 2011-09-15 MED ORDER — HYDROCHLOROTHIAZIDE 25 MG PO TABS
25.0000 mg | ORAL_TABLET | Freq: Every day | ORAL | Status: DC
  Administered 2011-09-16: 25 mg via ORAL
  Filled 2011-09-15 (×2): qty 1

## 2011-09-15 MED ORDER — ONDANSETRON HCL 4 MG/2ML IJ SOLN
INTRAMUSCULAR | Status: DC | PRN
  Administered 2011-09-15: 4 mg via INTRAVENOUS

## 2011-09-15 MED ORDER — METHOCARBAMOL 100 MG/ML IJ SOLN: 500.0000 mg | Freq: Four times a day (QID) | INTRAVENOUS | Status: DC | PRN

## 2011-09-15 MED ORDER — LACTATED RINGERS IV SOLN
INTRAVENOUS | Status: DC
  Administered 2011-09-15 (×2): via INTRAVENOUS

## 2011-09-15 MED ORDER — NEOSTIGMINE METHYLSULFATE 1 MG/ML IJ SOLN
INTRAMUSCULAR | Status: DC | PRN
  Administered 2011-09-15: 5 mg via INTRAVENOUS

## 2011-09-15 MED ORDER — ONDANSETRON HCL 4 MG/2ML IJ SOLN: 4.0000 mg | INTRAMUSCULAR | Status: DC | PRN

## 2011-09-15 MED ORDER — ACETAMINOPHEN 10 MG/ML IV SOLN
1000.0000 mg | Freq: Four times a day (QID) | INTRAVENOUS | Status: DC
  Administered 2011-09-15 – 2011-09-16 (×3): 1000 mg via INTRAVENOUS
  Filled 2011-09-15 (×4): qty 100

## 2011-09-15 MED ORDER — MEPERIDINE HCL 25 MG/ML IJ SOLN: 6.2500 mg | INTRAMUSCULAR | Status: DC | PRN

## 2011-09-15 MED ORDER — HYDROMORPHONE HCL PF 1 MG/ML IJ SOLN
0.2500 mg | INTRAMUSCULAR | Status: DC | PRN
  Administered 2011-09-15 (×2): 0.5 mg via INTRAVENOUS

## 2011-09-15 MED ORDER — METHOCARBAMOL 500 MG PO TABS: 500.0000 mg | ORAL_TABLET | Freq: Four times a day (QID) | ORAL | Status: DC | PRN

## 2011-09-15 MED ORDER — ROCURONIUM BROMIDE 100 MG/10ML IV SOLN
INTRAVENOUS | Status: DC | PRN
  Administered 2011-09-15: 50 mg via INTRAVENOUS

## 2011-09-15 MED ORDER — PANTOPRAZOLE SODIUM 40 MG IV SOLR
40.0000 mg | Freq: Every day | INTRAVENOUS | Status: DC
  Administered 2011-09-15: 40 mg via INTRAVENOUS
  Filled 2011-09-15 (×2): qty 40

## 2011-09-15 MED ORDER — FENTANYL CITRATE 0.05 MG/ML IJ SOLN
INTRAMUSCULAR | Status: DC | PRN
  Administered 2011-09-15: 50 ug via INTRAVENOUS
  Administered 2011-09-15: 100 ug via INTRAVENOUS

## 2011-09-15 MED ORDER — MORPHINE SULFATE 4 MG/ML IJ SOLN
1.0000 mg | INTRAMUSCULAR | Status: DC | PRN
  Administered 2011-09-15 – 2011-09-16 (×4): 4 mg via INTRAVENOUS
  Filled 2011-09-15 (×4): qty 1

## 2011-09-15 MED ORDER — PROMETHAZINE HCL 25 MG/ML IJ SOLN: 6.2500 mg | INTRAMUSCULAR | Status: DC | PRN

## 2011-09-15 MED ORDER — ACETAMINOPHEN 10 MG/ML IV SOLN
1000.0000 mg | Freq: Once | INTRAVENOUS | Status: DC
  Filled 2011-09-15: qty 100

## 2011-09-15 MED ORDER — OXYCODONE HCL 5 MG PO TABS
10.0000 mg | ORAL_TABLET | ORAL | Status: DC | PRN
  Administered 2011-09-16: 10 mg via ORAL
  Filled 2011-09-15: qty 2

## 2011-09-15 MED ORDER — CYCLOBENZAPRINE HCL 10 MG PO TABS
10.0000 mg | ORAL_TABLET | Freq: Two times a day (BID) | ORAL | Status: DC | PRN
  Administered 2011-09-16: 10 mg via ORAL
  Filled 2011-09-15: qty 1

## 2011-09-15 MED ORDER — MIDAZOLAM HCL 5 MG/5ML IJ SOLN
INTRAMUSCULAR | Status: DC | PRN
  Administered 2011-09-15: 2 mg via INTRAVENOUS

## 2011-09-15 MED ORDER — ZOLPIDEM TARTRATE 5 MG PO TABS: 10.0000 mg | ORAL_TABLET | Freq: Every evening | ORAL | Status: DC | PRN

## 2011-09-15 MED ORDER — DOCUSATE SODIUM 100 MG PO CAPS
100.0000 mg | ORAL_CAPSULE | Freq: Two times a day (BID) | ORAL | Status: DC
  Administered 2011-09-15 – 2011-09-16 (×2): 100 mg via ORAL
  Filled 2011-09-15 (×2): qty 1

## 2011-09-15 MED ORDER — CEFAZOLIN SODIUM 1-5 GM-% IV SOLN
1.0000 g | Freq: Three times a day (TID) | INTRAVENOUS | Status: AC
  Administered 2011-09-15 – 2011-09-16 (×2): 1 g via INTRAVENOUS
  Filled 2011-09-15 (×2): qty 50

## 2011-09-15 SURGICAL SUPPLY — 57 items
BUR EGG ELITE 4.0 (BURR) IMPLANT
CANISTER SUCTION 2500CC (MISCELLANEOUS) ×2 IMPLANT
CLOTH BEACON ORANGE TIMEOUT ST (SAFETY) ×2 IMPLANT
CORDS BIPOLAR (ELECTRODE) ×2 IMPLANT
COVER SURGICAL LIGHT HANDLE (MISCELLANEOUS) ×2 IMPLANT
DRAIN PENROSE 1/4X12 LTX STRL (WOUND CARE) ×2 IMPLANT
DRAPE POUCH INSTRU U-SHP 10X18 (DRAPES) ×2 IMPLANT
DRAPE PROXIMA HALF (DRAPES) ×2 IMPLANT
DRAPE SURG 17X23 STRL (DRAPES) ×2 IMPLANT
DRAPE U-SHAPE 47X51 STRL (DRAPES) ×2 IMPLANT
DRSG ADAPTIC 3X8 NADH LF (GAUZE/BANDAGES/DRESSINGS) ×2 IMPLANT
DRSG MEPILEX BORDER 4X8 (GAUZE/BANDAGES/DRESSINGS) ×2 IMPLANT
DRSG PAD ABDOMINAL 8X10 ST (GAUZE/BANDAGES/DRESSINGS) ×2 IMPLANT
DURAPREP 26ML APPLICATOR (WOUND CARE) ×2 IMPLANT
ELECT BLADE 4.0 EZ CLEAN MEGAD (MISCELLANEOUS)
ELECT CAUTERY BLADE 6.4 (BLADE) ×2 IMPLANT
ELECT REM PT RETURN 9FT ADLT (ELECTROSURGICAL) ×2
ELECTRODE BLDE 4.0 EZ CLN MEGD (MISCELLANEOUS) IMPLANT
ELECTRODE REM PT RTRN 9FT ADLT (ELECTROSURGICAL) ×1 IMPLANT
EVACUATOR 1/8 PVC DRAIN (DRAIN) IMPLANT
GLOVE BIOGEL PI IND STRL 6.5 (GLOVE) ×1 IMPLANT
GLOVE BIOGEL PI IND STRL 8.5 (GLOVE) ×1 IMPLANT
GLOVE BIOGEL PI INDICATOR 6.5 (GLOVE) ×1
GLOVE BIOGEL PI INDICATOR 8.5 (GLOVE) ×1
GLOVE ECLIPSE 6.0 STRL STRAW (GLOVE) ×2 IMPLANT
GLOVE ECLIPSE 8.5 STRL (GLOVE) ×2 IMPLANT
GOWN PREVENTION PLUS XXLARGE (GOWN DISPOSABLE) ×2 IMPLANT
GOWN STRL NON-REIN LRG LVL3 (GOWN DISPOSABLE) ×4 IMPLANT
KIT BASIN OR (CUSTOM PROCEDURE TRAY) ×2 IMPLANT
KIT ROOM TURNOVER OR (KITS) ×2 IMPLANT
NEEDLE 22X1 1/2 (OR ONLY) (NEEDLE) ×2 IMPLANT
NEEDLE SPNL 18GX3.5 QUINCKE PK (NEEDLE) ×4 IMPLANT
NS IRRIG 1000ML POUR BTL (IV SOLUTION) ×2 IMPLANT
PACK LAMINECTOMY ORTHO (CUSTOM PROCEDURE TRAY) ×2 IMPLANT
PACK UNIVERSAL I (CUSTOM PROCEDURE TRAY) ×2 IMPLANT
PAD ARMBOARD 7.5X6 YLW CONV (MISCELLANEOUS) ×4 IMPLANT
PATTIES SURGICAL .5 X.5 (GAUZE/BANDAGES/DRESSINGS) IMPLANT
PATTIES SURGICAL .5 X1 (DISPOSABLE) IMPLANT
PIN SAFETY STERILE (MISCELLANEOUS) ×2 IMPLANT
SPONGE GAUZE 4X4 STERILE 39 (GAUZE/BANDAGES/DRESSINGS) ×2 IMPLANT
SPONGE SURGIFOAM ABS GEL 100 (HEMOSTASIS) IMPLANT
STRIP CLOSURE SKIN 1/2X4 (GAUZE/BANDAGES/DRESSINGS) ×2 IMPLANT
SURGIFLO TRUKIT (HEMOSTASIS) IMPLANT
SUT MNCRL AB 3-0 PS2 18 (SUTURE) ×2 IMPLANT
SUT PDS AB 1 CTX 36 (SUTURE) ×6 IMPLANT
SUT VIC AB 0 CT1 27 (SUTURE) ×1
SUT VIC AB 0 CT1 27XBRD ANBCTR (SUTURE) ×1 IMPLANT
SUT VIC AB 1 CTX 36 (SUTURE) ×2
SUT VIC AB 1 CTX36XBRD ANBCTR (SUTURE) ×2 IMPLANT
SUT VIC AB 2-0 CT1 18 (SUTURE) ×2 IMPLANT
SYR BULB IRRIGATION 50ML (SYRINGE) ×2 IMPLANT
SYR CONTROL 10ML LL (SYRINGE) ×2 IMPLANT
TAPE PAPER 3X10 WHT MICROPORE (GAUZE/BANDAGES/DRESSINGS) ×2 IMPLANT
TOWEL OR 17X24 6PK STRL BLUE (TOWEL DISPOSABLE) ×2 IMPLANT
TOWEL OR 17X26 10 PK STRL BLUE (TOWEL DISPOSABLE) ×2 IMPLANT
WATER STERILE IRR 1000ML POUR (IV SOLUTION) ×2 IMPLANT
YANKAUER SUCT BULB TIP NO VENT (SUCTIONS) ×2 IMPLANT

## 2011-09-15 NOTE — Op Note (Signed)
OPERATIVE REPORT  DATE OF SURGERY: 09/15/2011  PATIENT NAME:  Annette Mccullough MRN: [id number] DOB: [birth year]  PCP: Conchita Paris, MD  PRE-OPERATIVE DIAGNOSIS:  Wound dehiscence  POST-OPERATIVE DIAGNOSIS:  Same  PROCEDURE:   Revision wound closure  SURGEON:  Venita Lick, MD  PHYSICIAN ASSISTANT: None   ANESTHESIA:   General  EBL: Minimal ml   BRIEF HISTORY: [name] is a 65 y.o. female who had a spinal cord stimulator placed approximately 2 and half weeks ago. Due to her body habitus there was difficulty of thoracic wound healing. As a result when I evaluated her there was an obvious dehiscence of the wound without evidence of infection. As a result of the dehiscence I elected to take her back to the operating room to freshen wound edges and and close the wound primarily. The potential risk of a secondary wound healing for possible infection I felt was too great and so I thought this was the best course of action. I discussed the risks benefits and alternatives with the patient and her daughter and consent was obtained.  PROCEDURE DETAILS: Patient was brought into the operating room. After successful induction of general anesthesia and tracheal intubation a Time Out was done. This confirmed all pertinent important data. Patient was turned in the lateral decubitus position all bony prominences were well-padded and the back was prepped and draped in a standard fashion.  The suture that was still in place was removed the wound edges were freshened with a 15 blade scalpel. I dissected down to the deep fascia and there was no purulent drainage no serous drainage. Cultures were taken and antibiotics were then administrated. The wounds and pelvis irrigated with over a liter of the fluid. I then placed vancomycin powder into the wound as a precaution against infection and then closed over 10 range drain with some vertical mattress sutures. The wound edges were reapproximated and the  wound appeared healthier. Bulky dry dressing was applied as was paper tape given her  tape sensitivity.  The patient was extubated and transferred to the PACU without incident. She will be admitted for overnight observation IV antibiotic administration.  Dressings will be changed in the morning the Penrose drain will be removed and the remainder of the wound closed. Venita Lick, MD 09/15/2011 5:31 PM

## 2011-09-15 NOTE — Addendum Note (Signed)
Addendum  created 09/15/11 1819 by Josepha Pigg, MD   Modules edited:Orders, PRL Based Order Sets

## 2011-09-15 NOTE — Progress Notes (Signed)
Pt has spinal cord stimulator on.

## 2011-09-15 NOTE — Anesthesia Postprocedure Evaluation (Signed)
  Anesthesia Post-op Note  Patient: Annette Mccullough  Procedure(s) Performed:  LUMBAR WOUND DEBRIDEMENT - REPEAT I&D AND WOUND CLOSURE OF SPINAL WOUND  Patient Location: PACU  Anesthesia Type: General  Level of Consciousness: alert   Airway and Oxygen Therapy: Patient connected to nasal cannula oxygen  Post-op Pain: mild  Post-op Assessment: Post-op Vital signs reviewed  Post-op Vital Signs: stable  Complications: No apparent anesthesia complications

## 2011-09-15 NOTE — Transfer of Care (Signed)
Immediate Anesthesia Transfer of Care Note  Patient: Annette Mccullough  Procedure(s) Performed:  LUMBAR WOUND DEBRIDEMENT - REPEAT I&D AND WOUND CLOSURE OF SPINAL WOUND  Patient Location: PACU  Anesthesia Type: General  Level of Consciousness: awake  Airway & Oxygen Therapy: Patient Spontanous Breathing and Patient connected to face mask oxygen  Post-op Assessment: Report given to PACU RN, Post -op Vital signs reviewed and stable and Patient moving all extremities X 4  Post vital signs: Reviewed and stable  Complications: No apparent anesthesia complications

## 2011-09-15 NOTE — Preoperative (Signed)
Beta Blockers   Reason not to administer Beta Blockers:Pt took BB 09-15-11 in am

## 2011-09-15 NOTE — H&P (Signed)
SEE OFFICE NOTES FOR DETAILS OF H+P PATIENT S/P SPINAL CORD STIMULATOR PLACEMENT PATIENT WITH EARLY WOUND DEHISCENCE WITHOUT EVIDENCE OF INFECTION WOUND ISSUE PRIMARILY DUE TO BODY HABITUS PLAN ON WOUND CLOSURE IN OR TODAY ALL RISKS/BENEFITS REVIEWED NO CHANGE TO HISTORY

## 2011-09-15 NOTE — Anesthesia Preprocedure Evaluation (Addendum)
Anesthesia Evaluation  Patient identified by MRN, date of birth, ID band Patient awake    Reviewed: Allergy & Precautions, H&P , NPO status , Patient's Chart, lab work & pertinent test results, reviewed documented beta blocker date and time   Airway Mallampati: II TM Distance: >3 FB Neck ROM: Full    Dental No notable dental hx. (+) Edentulous Upper, Partial Lower and Dental Advisory Given   Pulmonary former smoker (quit 2005) clear to auscultation  Pulmonary exam normal       Cardiovascular hypertension (took BP meds this am), Pt. on home beta blockers and Pt. on medications Regular Normal    Neuro/Psych  Neuromuscular disease CVA (L weakness ), Residual Symptoms    GI/Hepatic negative GI ROS, Neg liver ROS,   Endo/Other  Negative Endocrine ROSHypothyroidism Morbid obesity  Renal/GU negative Renal ROS     Musculoskeletal  (+) Fibromyalgia -, narcotic dependent  Abdominal (+) obese,   Peds  Hematology   Anesthesia Other Findings   Reproductive/Obstetrics                         Anesthesia Physical Anesthesia Plan  ASA: III  Anesthesia Plan: General   Post-op Pain Management:    Induction: Intravenous  Airway Management Planned: Oral ETT  Additional Equipment:   Intra-op Plan:   Post-operative Plan: Extubation in OR  Informed Consent: I have reviewed the patients History and Physical, chart, labs and discussed the procedure including the risks, benefits and alternatives for the proposed anesthesia with the patient or authorized representative who has indicated his/her understanding and acceptance.   Dental advisory given  Plan Discussed with: CRNA, Surgeon and Anesthesiologist  Anesthesia Plan Comments: (Plan routine monitors, GETA)       Anesthesia Quick Evaluation

## 2011-09-16 ENCOUNTER — Encounter (HOSPITAL_COMMUNITY): Payer: Self-pay | Admitting: Orthopedic Surgery

## 2011-09-16 MED ORDER — CEPHALEXIN 500 MG PO CAPS: 500.0000 mg | ORAL_CAPSULE | Freq: Four times a day (QID) | ORAL | Status: AC

## 2011-09-16 MED FILL — Mupirocin Oint 2%: CUTANEOUS | Qty: 22 | Status: AC

## 2011-09-16 NOTE — Progress Notes (Signed)
PT. BEGAN C/O TONGUE BEGINNING TO SWELL AT 2120 ON 12/12; DR. Luberta Robertson AND PA UNDERWOOD RETURNING PAGE AND GIVING ORDER FOR BENADRYL PO PRN.  AFTER THE FIRST DOSE WAS GIVEN, PT. SAID THE SWELLING WAS GOING DOWN.  PT. ALSO EATING ICE CHIPS AND THIS ALSO SEEMS TO BE HELPING.  PT. HAVING NO DIFFICULTIES SWALLOWING.     CHRIS Takumi Din RN

## 2011-09-16 NOTE — Discharge Summary (Addendum)
Patient ID: Annette Mccullough MRN: [id number] DOB/AGE: 65/05/1946 65 y.o.  Admit date: 09/15/2011 Discharge date: 09/16/2011  Admission Diagnoses:  Active Problems: Lumbar wound dehiscence   Discharge Diagnoses:  Same  Past Medical History  Diagnosis Date  . Complication of anesthesia     post anesthesia - swelling of lip- relative to tape  . Heart murmur     rec's antibiotic prior to dental work, never had any cardiac testing done  . Hypertension   . Hypothyroidism     R side cyst, will have f/u at a later date  . Blood transfusion     74- post childbirth & /w joint replacement   . Chronic kidney disease     renal calculi /w pregnancy  . Neuromuscular disorder     DDD- lumbar & cervical   . Arthritis     multiple areas  . Fibromyalgia   . Depression   . 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 female who was admitted 09/15/2011 for operative treatment of lumbar wound dehiscence. Patient has severe unremitting pain that affects sleep, daily activities, and work/hobbies. After pre-op clearance the patient was taken to the operating room on 09/15/2011 and underwent  Procedure(s): LUMBAR WOUND DEBRIDEMENT.    Patient was given perioperative antibiotics: Anti-infectives     Start     Dose/Rate Route Frequency Ordered Stop   09/16/11 0000   cephALEXin (KEFLEX) 500 MG capsule        500 mg Oral 4 times daily 09/16/11 0955 09/26/11 2359   09/15/11 2200   ceFAZolin (ANCEF) IVPB 1 g/50 mL premix        1 g 100 mL/hr over 30 Minutes Intravenous 3 times per day 09/15/11 1918 09/16/11 0641   09/15/11 1811   vancomycin (VANCOCIN) powder  Status:  Discontinued          As needed 09/15/11 1812 09/15/11 1844   09/15/11 1645   ceFAZolin (ANCEF) IVPB 2 g/50 mL premix        2 g 100  mL/hr over 30 Minutes Intravenous To Surgery 09/15/11 1638 09/15/11 1655   09/15/11 1645   vancomycin (VANCOCIN) powder 1,000 mg        1,000 mg Other To Surgery 09/15/11 1638 09/16/11 1645           Patient was given sequential compression devices and early ambulation to prevent DVT.  Patient benefited maximally from hospital stay and there were no complications.    Recent vital signs: Patient Vitals for the past 24 hrs:  BP Temp Temp src Pulse Resp SpO2 Height Weight  09/16/11 0800 152/87 mmHg 97.4 F (36.3 C) - 86  16  97 % - -  09/16/11 0400 137/72 mmHg 97.7 F (36.5 C) Oral 75  16  100 % - -  09/16/11 0200 128/51 mmHg 97.9 F (36.6 C) Oral 76  18  98 % - -  09/16/11 0000 132/68 mmHg 98.3 F (36.8 C) Oral 67  18  98 % - -  09/15/11 2154 130/75 mmHg 98.1 F (36.7 C) Oral 69  16  100 % - -  09/15/11 2000 132/84 mmHg 97.9 F (36.6 C) - 85  18  99 % - -  09/15/11 1910 142/82 mmHg 97.7 F (36.5 C) -  85  18  96 % - -  09/15/11 1841 - 97.6 F (36.4 C) - 79  15  97 % - -  09/15/11 1840 134/83 mmHg - - 76  16  97 % - -  09/15/11 1839 - - - 80  21  97 % - -  09/15/11 1838 - - - 78  24  100 % - -  09/15/11 1837 - - - 80  25  100 % - -  09/15/11 1836 - - - 77  13  100 % - -  09/15/11 1835 - - - 78  16  99 % - -  09/15/11 1834 - - - 78  16  99 % - -  09/15/11 1833 - - - 80  23  99 % - -  09/15/11 1832 - - - 78  12  100 % - -  09/15/11 1831 - - - 78  12  100 % - -  09/15/11 1830 - - - 78  17  99 % - -  09/15/11 1829 - - - 76  12  100 % - -  09/15/11 1828 - - - 82  18  100 % - -  09/15/11 1827 - - - 75  15  100 % - -  09/15/11 1826 61/49 mmHg - - 78  18  98 % - -  09/15/11 1825 - - - 75  13  99 % - -  09/15/11 1824 - - - 75  14  100 % - -  09/15/11 1823 - - - 75  14  99 % - -  09/15/11 1822 - - - 75  13  99 % - -  09/15/11 1821 - - - 75  17  100 % - -  09/15/11 1820 - - - 74  15  100 % - -  09/15/11 1819 - - - 75  12  99 % - -  09/15/11 1818 - - - 75  13  100 % - -    09/15/11 1817 - - - 76  12  100 % - -  09/15/11 1816 - - - 77  16  100 % - -  09/15/11 1815 - - - 77  15  100 % - -  09/15/11 1814 - - - 76  13  100 % - -  09/15/11 1813 - - - 75  21  100 % - -  09/15/11 1812 111/67 mmHg - - 74  13  100 % - -  09/15/11 1811 81/67 mmHg - - 75  13  100 % - -  09/15/11 1810 - - - 73  14  100 % - -  09/15/11 1809 - - - 74  14  100 % - -  09/15/11 1808 - - - 73  15  99 % - -  09/15/11 1807 - - - 73  14  100 % - -  09/15/11 1806 - - - 75  14  100 % - -  09/15/11 1805 - - - 74  12  100 % - -  09/15/11 1804 - - - 71  13  100 % - -  09/15/11 1803 - - - 72  10  100 % - -  09/15/11 1802 - - - 72  17  100 % - -  09/15/11 1801 - - - 73  15  100 % - -  09/15/11 1800 - - - 73  15  100 % - -  09/15/11 1759 - - - 73  14  100 % - -  09/15/11 1758 - - - 73  18  100 % - -  09/15/11 1757 - - - 73  16  100 % - -  09/15/11 1756 - - - 73  16  100 % - -  09/15/11 1755 116/100 mmHg - - 73  18  100 % - -  09/15/11 1754 - - - 72  15  100 % - -  09/15/11 1753 - - - 72  16  100 % - -  09/15/11 1752 - - - 71  17  100 % - -  09/15/11 1751 - - - 72  15  100 % - -  09/15/11 1750 - - - 72  15  100 % - -  09/15/11 1749 - - - 73  19  100 % - -  09/15/11 1748 - - - 72  17  100 % - -  09/15/11 1747 - - - 72  13  100 % - -  09/15/11 1746 - - - 72  16  100 % - -  09/15/11 1745 - 96.9 F (36.1 C) - 72  16  100 % - -  09/15/11 1744 - - - 72  16  100 % - -  09/15/11 1743 - - - 78  15  100 % - -  09/15/11 1742 - - - 85  13  100 % - -  09/15/11 1741 - - - 85  20  100 % - -  09/15/11 1740 124/83 mmHg - - 87  - 99 % - -  09/15/11 1325 - - - - - - 5\' 7"  (1.702 m) 115.667 kg (255 lb)  09/15/11 1243 126/72 mmHg 97.9 F (36.6 C) Oral 84  20  99 % - -     Recent laboratory studies:  Basename 09/15/11 1243  WBC 9.5  HGB 11.7*  HCT 34.9*  PLT 299  NA 139  K 5.0  CL 102  CO2 29  BUN 15  CREATININE 0.70  GLUCOSE 102*  INR --  CALCIUM 10.1     Discharge Medications:  Current  Discharge Medication List    START taking these medications   Details  cephALEXin (KEFLEX) 500 MG capsule Take 1 capsule (500 mg total) by mouth 4 (four) times daily. Qty: 40 capsule, Refills: 0      CONTINUE these medications which have NOT CHANGED   Details  cyclobenzaprine (FLEXERIL) 10 MG tablet Take 10 mg by mouth 2 (two) times daily as needed. Takes 10 mg in the am and 20 mg in the evening For muscle spasms      DULoxetine (CYMBALTA) 60 MG capsule Take 60 mg by mouth daily.      enalapril (VASOTEC) 20 MG tablet Take 20 mg by mouth 2 (two) times daily.      hydrochlorothiazide (HYDRODIURIL) 25 MG tablet Take 25 mg by mouth daily.      HYDROcodone-acetaminophen (NORCO) 5-325 MG per tablet Take 1 tablet by mouth 4 (four) times daily.      metoprolol tartrate (LOPRESSOR) 25 MG tablet Take 25 mg by mouth daily. Pt. Takes only in p.m.        Diagnostic Studies: X-ray Chest Pa And Lateral  08/18/2011  *RADIOLOGY REPORT*  Clinical Data: Preoperative respiratory exam for spinal cord stimulator.  Hypertension.  Smoking history.  CHEST - 2 VIEW  Comparison: None  Findings:  Heart size is normal.  The aorta is unfolded.  The lungs are clear.  The vascularity is normal.  No effusions.  No acute bony finding.  Previous cervical ACDF.  IMPRESSION: No active disease  Original Report Authenticated By: Thomasenia Sales, M.D.   Dg Thoracic Spine 2 View  08/25/2011  *RADIOLOGY REPORT*  Clinical Data: Spinal cord stimulator placement  THORACIC SPINE - 2 VIEW  Comparison: 08/18/2011  Findings: Two spot images from intraoperative C-arm fluoroscopy document placement of 2 dorsal stimulator catheters to the T8 level.  IMPRESSION:  1.  Intraoperative localization.  Original Report Authenticated By: Osa Craver, M.D.   X-ray Lumbar Spine Ap And Lateral  08/18/2011  *RADIOLOGY REPORT*  Clinical Data: Preop for spinal cord stimulator.  Hypertension.  LUMBAR SPINE - 2-3 VIEW  Comparison: None.   Findings: Five lumbar type vertebral bodies are assumed.  There is normal alignment.  Intervertebral disc spaces are preserved.  IMPRESSION: As above.  Original Report Authenticated By: Elsie Stain, M.D.   Dg C-arm Gt 120 Min  08/25/2011  CLINICAL DATA: Spinal Cord Stimulator   C-ARM GT 120 MIN  Fluoroscopy was utilized by the requesting physician.  No radiographic  interpretation.      Disposition: Home or Self Care  Discharge Orders    Future Orders Please Complete By Expires   Diet - low sodium heart healthy      Call MD / Call 911      Comments:   If you experience chest pain or shortness of breath, CALL 911 and be transported to the hospital emergency room.  If you develope a fever above 101 F, pus (white drainage) or increased drainage or redness at the wound, or calf pain, call your surgeon's office.   Constipation Prevention      Comments:   Drink plenty of fluids.  Prune juice may be helpful.  You may use a stool softener, such as Colace (over the counter) 100 mg twice a day.  Use MiraLax (over the counter) for constipation as needed.   Increase activity slowly as tolerated      Weight Bearing as taught in Physical Therapy      Comments:   Use a walker or crutches as instructed.   Discharge wound care:      Comments:   KEEP INCISION CLEAN AND DRY.  Change your bandage as instructed by your health care providers.  If your bandage has been discontinued, keep your incision clean and dry.  NO bathing or showering.   DO NOT put ointments, lotion or powder on your incision.      Follow-up Information    Follow up with Royelle Hinchman D in 1 week.   Contact information:   [facility] [address] [place] [address] [phone]         Discharge Plan: discharge to home Disposition at time of discharge:  STABLE  Signed: Gwinda Maine 09/16/2011, 9:55 AM    Doing well Agree with above Wound ok Plan on D/C  Will set  up [facility] nsg to monitor wound

## 2011-09-16 NOTE — Progress Notes (Signed)
Subjective: Procedure(s) (LRB): LUMBAR WOUND DEBRIDEMENT (N/A) 1 Day Post-Op  Patient reports pain as moderate.  Reports none leg pain reports incisional back pain   Positive void Negative bowel movement Positive flatus Negative chest pain or shortness of breath  Objective: Vital signs in last 24 hours: Temp:  [96.9 F (36.1 C)-98.3 F (36.8 C)] 97.4 F (36.3 C) (12/13 0800) Pulse Rate:  [67-87] 86  (12/13 0800) Resp:  [10-25] 16  (12/13 0800) BP: (61-152)/(49-100) 152/87 mmHg (12/13 0800) SpO2:  [96 %-100 %] 97 % (12/13 0800) Weight:  [115.667 kg (255 lb)] 255 lb (115.667 kg) (12/12 1325)  Intake/Output from previous day: 12/12 0701 - 12/13 0700 In: 2955 [I.V.:2855; IV Piggyback:100] Out: 50 [Blood:50]   Basename 09/15/11 1243  WBC 9.5  RBC 3.71*  HCT 34.9*  PLT 299    Basename 09/15/11 1243  NA 139  K 5.0  CL 102  CO2 29  BUN 15  CREATININE 0.70  GLUCOSE 102*  CALCIUM 10.1   No results found for this basename: LABPT:2,INR:2 in the last 72 hours  ABD soft Neurovascular intact Sensation intact distally Intact pulses distally Incision: dressing C/D/I Compartment soft  Assessment/Plan: Patient stable  Drain removed; wound redressed  Continue care  Discharge home today  Annette Mccullough 09/16/2011, 9:49 AM

## 2011-09-16 NOTE — Progress Notes (Signed)
Physical Therapy Evaluation Patient Details Name: Annette Mccullough MRN: [id number] DOB: 1946/06/12 Today's Date: 09/16/2011  Problem List:  Patient Active Problem List  Diagnoses  . Chronic pain  . Morbid obesity, BMI unknown  . Hypertension    Past Medical History:  Past Medical History  Diagnosis Date  . Complication of anesthesia     post anesthesia - swelling of lip- relative to tape  . Heart murmur     rec's antibiotic prior to dental work, never had any cardiac testing done  . Hypertension   . Hypothyroidism     R side cyst, will have f/u at a later date  . Blood transfusion     74- post childbirth & /w joint replacement   . Chronic kidney disease     renal calculi /w pregnancy  . Neuromuscular disorder     DDD- lumbar & cervical   . Arthritis     multiple areas  . Fibromyalgia   . Depression   . Stroke     1972 & 2005, 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- some weakness remains    Past Surgical History:  Past Surgical History  Procedure Date  . Abdominal hysterectomy   . Joint replacement     1999- R knee  . Cervical fusion     2011  . Cesarean section     2 times   . Tonsillectomy     as a child  . Spinal cord stimulator insertion 08/25/2011    Procedure: LUMBAR SPINAL CORD STIMULATOR INSERTION;  Surgeon: Alvy Beal;  Location: [facility] OR;  Service: Orthopedics;  Laterality: N/A;    PT Assessment/Plan/Recommendation PT Assessment Clinical Impression Statement: Patient is aware of back care from prior surgery and has all necessary equipment. PT Recommendation/Assessment: Patent does not need any further PT services No Skilled PT: All education completed;Patient at baseline level of functioning;Patient is modified independent with all activity/mobility PT Recommendation Follow Up Recommendations: None Equipment Recommended: None recommended by PT  PT Evaluation Precautions/Restrictions  Precautions Precautions: Back Precaution Booklet Issued: Yes  (comment) Precaution Comments: Educated in posture, body mechanics, and back precautions. Prior Functioning  Home Living Lives With: Alone Receives Help From: Other (Comment) Type of Home: Apartment Home Layout: One level Home Access: Stairs to enter Entrance Stairs-Rails: Right Entrance Stairs-Number of Steps: 1 Bathroom Shower/Tub: Engineer, manufacturing systems: Standard Home Adaptive Equipment: Reacher;Sock aid Prior Function Level of Independence: Independent with basic ADLs;Independent with gait;Independent with transfers;Needs assistance with homemaking (Home help for heavy housework -vacuuming etc.) Driving: Yes Vocation: Retired Producer, television/film/video: Awake/alert Overall Cognitive Status: Appears within functional limits for tasks assessed Orientation Level: Oriented X4 Sensation/Coordination  Nothing abnormal reported Extremity Assessment RLE Assessment RLE Assessment: Within Functional Limits LLE Assessment LLE Assessment: Within Functional Limits Mobility (including Balance) Bed Mobility Rolling Right: 6: Modified independent (Device/Increase time) Right Sidelying to Sit: 6: Modified independent (Device/Increase time) Sit to Supine - Right: 6: Modified independent (Device/Increase time) Transfers Sit to Stand: 7: Independent;From bed Stand to Sit: 7: Independent;To bed Ambulation/Gait Ambulation/Gait Assistance: 6: Modified independent (Device/Increase time) Ambulation Distance (Feet): 180 Feet Assistive device: Straight cane Gait Pattern: Within Functional Limits   Patient with good body mechanics with mobilty End of Session PT - End of Session Activity Tolerance: Patient tolerated treatment well Patient left: in bed;with call bell in reach General Behavior During Session: [facility] for tasks performed Cognition: [facility] for tasks performed Edwyna Perfect, PT  Pager [phone]  09/16/2011, 8:41 AM

## 2011-09-16 NOTE — Progress Notes (Signed)
Order received, chart reviewed, and spoke to Ms. Roseanne Reno about role of OT. She has post op back handout and is aware of her back precautions. She also has AE at home (reacher, sock aid, and wears slip on shoes for LBD). No acute OT needs identified, acute OT will sign off.   Ignacia Palma, OTR/L [phone] 09/16/2011

## 2011-09-18 LAB — WOUND CULTURE
Culture: NO GROWTH
Gram Stain: NONE SEEN

## 2011-09-20 LAB — ANAEROBIC CULTURE: Gram Stain: NONE SEEN

## 2012-02-16 ENCOUNTER — Ambulatory Visit: Payer: Self-pay | Admitting: Internal Medicine

## 2012-02-21 ENCOUNTER — Ambulatory Visit: Payer: Self-pay | Admitting: Internal Medicine

## 2012-02-25 ENCOUNTER — Ambulatory Visit: Payer: Self-pay | Admitting: Internal Medicine

## 2013-02-06 ENCOUNTER — Encounter (HOSPITAL_COMMUNITY): Payer: Self-pay | Admitting: Pharmacy Technician

## 2013-02-06 ENCOUNTER — Encounter (HOSPITAL_COMMUNITY): Payer: Self-pay | Admitting: *Deleted

## 2013-02-06 MED ORDER — CEFAZOLIN SODIUM-DEXTROSE 2-3 GM-% IV SOLR
2.0000 g | INTRAVENOUS | Status: DC
  Filled 2013-02-06: qty 50

## 2013-02-06 NOTE — H&P (Signed)
History of Present Illness The patient is a 67 year old female who presents today for follow up of their back. The patient is being followed for their central back pain. They are now 1 year(s) (6 months ) out from surgery (SCS placement 08/2011). Symptoms reported today include: pain (at area of battery, which is dead due to the patient not charging because of pain about the battery ) and weakness (bilat. with right greater than left ), while the patient does not report symptoms of: numbness or urinary incontinence. Current treatment includes: pain medications. The following medication has been used for pain control: Percocet (per [facility], Dr. Nilsa Nutting ).   Annette Mccullough returns today for a routine followup. The patient had a spinal cord stimulator placed in November 2012 and has done exceptionally well with it until a fall in September 2013. She states that about one month to 1-1/2 months after the fall she is experiencing significant battery site pain. With every recharge there is burning and shooting electrical pulses pain. She states that as a result she stopped charging it and stopped using it. Fortunately the back, buttock and leg pain that we put the stimulator in to treat did not recur. The patient states that at this point she has pain with direct palpation, pain when she sits all related to the battery site and she would like to have the batter removed.    Allergies Adhesive Tape Latex   Family History Hypertension. father Drug / Alcohol Addiction. mother and father Cancer. mother, father, sister and brother   Social History Number of flights of stairs before winded. less than 1 Marital status. widowed Living situation. live alone Tobacco / smoke exposure. no Pain Contract. yes Illicit drug use. no Current work status. disabled Children. 4 Alcohol use. current drinker; drinks wine; only occasionally per week Exercise. Exercises  never Drug/Alcohol Rehab (Previously). no Drug/Alcohol Rehab (Currently). no Tobacco use. former smoker; smoke(d) 1/2 pack(s) per day   Medication History Enalapril Maleate (20MG  Tablet, Oral) Active. Metoprolol Tartrate (25MG  Tablet, Oral) Active. Hydrochlorothiazide (25MG  Tablet, Oral) Active. Aspirin EC (325MG  Tablet DR, Oral) Active. Cymbalta (60MG  Capsule DR Part, Oral) Active. TiZANidine HCl ( Oral) Specific dose unknown - Active. Percocet ( Oral) Specific dose unknown - Active.   Vitals 01/30/2013 9:26 AM Weight: 265 lb Height: 67 in Body Surface Area: 2.38 m Body Mass Index: 41.5 kg/m BP: 145/85 (Sitting, Left Arm, Standard)    Objective Transcription  She is a pleasant woman who appears younger than her stated age. She is alert and oriented times three. She is ambulating with a cane.  Respiratory, no shortness of breath or chest pain.  On examination, she is neurologically intact. No focal motor or sensory deficits. Compartments are soft and nontender. Intact peripheral pulses. The incision site over the left gluteal region is clean, dry and intact. She has pain when I palpate or manipulate the battery. It does not appear to be grossly malpositioned.   Assessment & Plan  At this point, under sterile conditions I did a 4-cc injection of 0.25 percent plain Marcaine with 1 percent plain lidocaine and she tolerated this procedure well.  The patient states that the pain is no longer present. At this point, we discussed removal of the battery. This will be an outpatient procedure. Risks include infection, bleeding, nerve damage, death, stroke, paralysis, ongoing or worse pain and need for further surgery. I will remove the battery. If the patient's baseline back problems return, at some point  if we need to, we can always replace it with a new battery since I will leave the leads in place. Both the patient and her daughter are present for the dictation. All of  their questions were addressed. We will plan for this in the very near future.

## 2013-02-07 ENCOUNTER — Ambulatory Visit (HOSPITAL_COMMUNITY): Payer: Medicare Other

## 2013-02-07 ENCOUNTER — Encounter (HOSPITAL_COMMUNITY)
Admission: RE | Disposition: A | Discharge status: Home / Self Care | Payer: Self-pay | Source: Ambulatory Visit | Attending: Orthopedic Surgery

## 2013-02-07 ENCOUNTER — Ambulatory Visit (HOSPITAL_COMMUNITY)
Admission: RE | Admit: 2013-02-07 | Discharge: 2013-02-07 | Disposition: A | Discharge status: Home / Self Care | Payer: Medicare Other | Source: Ambulatory Visit | Attending: Orthopedic Surgery | Admitting: Orthopedic Surgery

## 2013-02-07 ENCOUNTER — Encounter (HOSPITAL_COMMUNITY): Payer: Self-pay | Admitting: Anesthesiology

## 2013-02-07 ENCOUNTER — Ambulatory Visit (HOSPITAL_COMMUNITY): Payer: Medicare Other | Admitting: Anesthesiology

## 2013-02-07 DIAGNOSIS — E039 Hypothyroidism, unspecified: Secondary | ICD-10-CM | POA: Insufficient documentation

## 2013-02-07 DIAGNOSIS — Z91040 Latex allergy status: Secondary | ICD-10-CM | POA: Insufficient documentation

## 2013-02-07 DIAGNOSIS — Z79899 Other long term (current) drug therapy: Secondary | ICD-10-CM | POA: Insufficient documentation

## 2013-02-07 DIAGNOSIS — T85890A Other specified complication of nervous system prosthetic devices, implants and grafts, initial encounter: Secondary | ICD-10-CM | POA: Insufficient documentation

## 2013-02-07 DIAGNOSIS — I129 Hypertensive chronic kidney disease with stage 1 through stage 4 chronic kidney disease, or unspecified chronic kidney disease: Secondary | ICD-10-CM | POA: Insufficient documentation

## 2013-02-07 DIAGNOSIS — IMO0001 Reserved for inherently not codable concepts without codable children: Secondary | ICD-10-CM | POA: Insufficient documentation

## 2013-02-07 DIAGNOSIS — Z7982 Long term (current) use of aspirin: Secondary | ICD-10-CM | POA: Insufficient documentation

## 2013-02-07 DIAGNOSIS — Z87891 Personal history of nicotine dependence: Secondary | ICD-10-CM | POA: Insufficient documentation

## 2013-02-07 DIAGNOSIS — Z6841 Body Mass Index (BMI) 40.0 and over, adult: Secondary | ICD-10-CM | POA: Insufficient documentation

## 2013-02-07 DIAGNOSIS — G709 Myoneural disorder, unspecified: Secondary | ICD-10-CM | POA: Insufficient documentation

## 2013-02-07 DIAGNOSIS — Y831 Surgical operation with implant of artificial internal device as the cause of abnormal reaction of the patient, or of later complication, without mention of misadventure at the time of the procedure: Secondary | ICD-10-CM | POA: Insufficient documentation

## 2013-02-07 DIAGNOSIS — N189 Chronic kidney disease, unspecified: Secondary | ICD-10-CM | POA: Insufficient documentation

## 2013-02-07 HISTORY — PX: SPINAL CORD STIMULATOR BATTERY EXCHANGE: SHX6202

## 2013-02-07 HISTORY — DX: Other seasonal allergic rhinitis: J30.2

## 2013-02-07 HISTORY — DX: Psoriasis, unspecified: L40.9

## 2013-02-07 LAB — CBC
HCT: 37.7 % (ref 36.0–46.0)
Hemoglobin: 13.4 g/dL (ref 12.0–15.0)
MCH: 31.9 pg (ref 26.0–34.0)
MCHC: 35.5 g/dL (ref 30.0–36.0)
MCV: 89.8 fL (ref 78.0–100.0)
Platelets: 325 10*3/uL (ref 150–400)
RBC: 4.2 MIL/uL (ref 3.87–5.11)
RDW: 13.7 % (ref 11.5–15.5)
WBC: 9.2 10*3/uL (ref 4.0–10.5)

## 2013-02-07 LAB — SURGICAL PCR SCREEN
MRSA, PCR: NEGATIVE
Staphylococcus aureus: NEGATIVE

## 2013-02-07 LAB — BASIC METABOLIC PANEL
BUN: 19 mg/dL (ref 6–23)
CO2: 28 mEq/L (ref 19–32)
Calcium: 10 mg/dL (ref 8.4–10.5)
Chloride: 102 mEq/L (ref 96–112)
Creatinine, Ser: 0.78 mg/dL (ref 0.50–1.10)
GFR calc Af Amer: >90 mL/min (ref >90)
GFR calc non Af Amer: 85 mL/min — ABNORMAL LOW (ref >90)
Glucose, Bld: 98 mg/dL (ref 70–99)
Potassium: 4.3 mEq/L (ref 3.5–5.1)
Sodium: 138 mEq/L (ref 135–145)

## 2013-02-07 SURGERY — SPINAL CORD STIMULATOR BATTERY EXCHANGE
Anesthesia: General | Site: Flank | Laterality: Left | Wound class: Clean

## 2013-02-07 MED ORDER — BUPIVACAINE-EPINEPHRINE 0.25% -1:200000 IJ SOLN
INTRAMUSCULAR | Status: DC | PRN
  Administered 2013-02-07: 20 mL

## 2013-02-07 MED ORDER — EPHEDRINE SULFATE 50 MG/ML IJ SOLN: INTRAMUSCULAR | Status: DC | PRN

## 2013-02-07 MED ORDER — DEXTROSE 5 % IV SOLN
3.0000 g | INTRAVENOUS | Status: AC
  Administered 2013-02-07: 3 g via INTRAVENOUS
  Filled 2013-02-07: qty 3000

## 2013-02-07 MED ORDER — ONDANSETRON HCL 4 MG/2ML IJ SOLN
INTRAMUSCULAR | Status: DC | PRN
  Administered 2013-02-07: 4 mg via INTRAVENOUS

## 2013-02-07 MED ORDER — BUPIVACAINE-EPINEPHRINE PF 0.25-1:200000 % IJ SOLN
INTRAMUSCULAR | Status: AC
  Filled 2013-02-07: qty 30

## 2013-02-07 MED ORDER — MUPIROCIN 2 % EX OINT
TOPICAL_OINTMENT | Freq: Two times a day (BID) | CUTANEOUS | Status: DC
  Administered 2013-02-07: 08:00:00 via NASAL
  Filled 2013-02-07: qty 22

## 2013-02-07 MED ORDER — ACETAMINOPHEN 10 MG/ML IV SOLN
1000.0000 mg | Freq: Once | INTRAVENOUS | Status: AC
  Administered 2013-02-07: 1000 mg via INTRAVENOUS

## 2013-02-07 MED ORDER — LIDOCAINE HCL (CARDIAC) 20 MG/ML IV SOLN
INTRAVENOUS | Status: DC | PRN
  Administered 2013-02-07: 50 mg via INTRAVENOUS

## 2013-02-07 MED ORDER — LIDOCAINE HCL 4 % MT SOLN
OROMUCOSAL | Status: DC | PRN
  Administered 2013-02-07: 4 mL via TOPICAL

## 2013-02-07 MED ORDER — FENTANYL CITRATE 0.05 MG/ML IJ SOLN
INTRAMUSCULAR | Status: DC | PRN
  Administered 2013-02-07 (×2): 50 ug via INTRAVENOUS

## 2013-02-07 MED ORDER — LACTATED RINGERS IV SOLN
INTRAVENOUS | Status: DC | PRN
  Administered 2013-02-07: 08:00:00 via INTRAVENOUS

## 2013-02-07 MED ORDER — ONDANSETRON HCL 4 MG/2ML IJ SOLN: 4.0000 mg | Freq: Once | INTRAMUSCULAR | Status: DC | PRN

## 2013-02-07 MED ORDER — 0.9 % SODIUM CHLORIDE (POUR BTL) OPTIME
TOPICAL | Status: DC | PRN
  Administered 2013-02-07: 1000 mL

## 2013-02-07 MED ORDER — EPHEDRINE SULFATE 50 MG/ML IJ SOLN
INTRAMUSCULAR | Status: DC | PRN
  Administered 2013-02-07: 5 mg via INTRAVENOUS

## 2013-02-07 MED ORDER — HYDROMORPHONE HCL PF 1 MG/ML IJ SOLN: 0.2500 mg | INTRAMUSCULAR | Status: DC | PRN

## 2013-02-07 MED ORDER — MUPIROCIN 2 % EX OINT
TOPICAL_OINTMENT | CUTANEOUS | Status: AC
  Filled 2013-02-07: qty 22

## 2013-02-07 MED ORDER — SUCCINYLCHOLINE CHLORIDE 20 MG/ML IJ SOLN
INTRAMUSCULAR | Status: DC | PRN
  Administered 2013-02-07: 120 mg via INTRAVENOUS

## 2013-02-07 MED ORDER — ACETAMINOPHEN 10 MG/ML IV SOLN
INTRAVENOUS | Status: AC
  Filled 2013-02-07: qty 100

## 2013-02-07 MED ORDER — PROPOFOL 10 MG/ML IV BOLUS
INTRAVENOUS | Status: DC | PRN
  Administered 2013-02-07: 200 mg via INTRAVENOUS

## 2013-02-07 SURGICAL SUPPLY — 48 items
BLADE SURG 10 STRL SS (BLADE) ×2 IMPLANT
BLADE SURG 15 STRL LF DISP TIS (BLADE) ×1 IMPLANT
BLADE SURG 15 STRL SS (BLADE) ×1
CANISTER SUCTION 2500CC (MISCELLANEOUS) IMPLANT
CLOTH BEACON ORANGE TIMEOUT ST (SAFETY) ×2 IMPLANT
CLSR STERI-STRIP ANTIMIC 1/2X4 (GAUZE/BANDAGES/DRESSINGS) ×2 IMPLANT
DRAPE INCISE IOBAN 66X45 STRL (DRAPES) ×2 IMPLANT
DRAPE PED LAPAROTOMY (DRAPES) ×2 IMPLANT
DRAPE SURG 17X23 STRL (DRAPES) ×2 IMPLANT
DRAPE U-SHAPE 47X51 STRL (DRAPES) ×2 IMPLANT
DRSG MEPILEX BORDER 4X4 (GAUZE/BANDAGES/DRESSINGS) ×2 IMPLANT
DURAPREP 26ML APPLICATOR (WOUND CARE) ×2 IMPLANT
ELECT CAUTERY BLADE 6.4 (BLADE) ×2 IMPLANT
ELECT REM PT RETURN 9FT ADLT (ELECTROSURGICAL) ×2
ELECTRODE REM PT RTRN 9FT ADLT (ELECTROSURGICAL) ×1 IMPLANT
GLOVE BIOGEL PI IND STRL 6.5 (GLOVE) ×1 IMPLANT
GLOVE BIOGEL PI IND STRL 8.5 (GLOVE) ×1 IMPLANT
GLOVE BIOGEL PI INDICATOR 6.5 (GLOVE) ×1
GLOVE BIOGEL PI INDICATOR 8.5 (GLOVE) ×1
GLOVE ECLIPSE 6.0 STRL STRAW (GLOVE) ×2 IMPLANT
GLOVE ECLIPSE 8.5 STRL (GLOVE) ×4 IMPLANT
GOWN PREVENTION PLUS XXLARGE (GOWN DISPOSABLE) ×2 IMPLANT
GOWN STRL NON-REIN LRG LVL3 (GOWN DISPOSABLE) ×4 IMPLANT
KIT BASIN OR (CUSTOM PROCEDURE TRAY) ×2 IMPLANT
KIT ROOM TURNOVER OR (KITS) ×2 IMPLANT
MANIFOLD NEPTUNE WASTE (CANNULA) ×2 IMPLANT
NEEDLE 22X1 1/2 (OR ONLY) (NEEDLE) ×2 IMPLANT
NS IRRIG 1000ML POUR BTL (IV SOLUTION) ×2 IMPLANT
PACK SURGICAL SETUP 50X90 (CUSTOM PROCEDURE TRAY) ×2 IMPLANT
PACK UNIVERSAL I (CUSTOM PROCEDURE TRAY) ×2 IMPLANT
PAD ARMBOARD 7.5X6 YLW CONV (MISCELLANEOUS) ×4 IMPLANT
PENCIL BUTTON HOLSTER BLD 10FT (ELECTRODE) ×2 IMPLANT
SPONGE LAP 18X18 X RAY DECT (DISPOSABLE) ×2 IMPLANT
SPONGE LAP 4X18 X RAY DECT (DISPOSABLE) IMPLANT
STAPLER VISISTAT 35W (STAPLE) ×2 IMPLANT
STRIP CLOSURE SKIN 1/2X4 (GAUZE/BANDAGES/DRESSINGS) ×2 IMPLANT
SUT MNCRL AB 3-0 PS2 18 (SUTURE) ×2 IMPLANT
SUT VIC AB 1 CT1 27 (SUTURE)
SUT VIC AB 1 CT1 27XBRD ANBCTR (SUTURE) IMPLANT
SUT VIC AB 2-0 CT1 18 (SUTURE) ×2 IMPLANT
SYR BULB IRRIGATION 50ML (SYRINGE) ×2 IMPLANT
SYR CONTROL 10ML LL (SYRINGE) ×2 IMPLANT
TOWEL OR 17X24 6PK STRL BLUE (TOWEL DISPOSABLE) ×4 IMPLANT
TOWEL OR 17X26 10 PK STRL BLUE (TOWEL DISPOSABLE) ×2 IMPLANT
TUBE CONNECTING 12X1/4 (SUCTIONS) ×4 IMPLANT
WATER STERILE IRR 1000ML POUR (IV SOLUTION) IMPLANT
WRENCH RATCHET 11 (INSTRUMENTS) ×2 IMPLANT
YANKAUER SUCT BULB TIP NO VENT (SUCTIONS) ×2 IMPLANT

## 2013-02-07 NOTE — H&P (Signed)
No change in clinical exam H+P reviewed  

## 2013-02-07 NOTE — Anesthesia Procedure Notes (Signed)
Procedure Name: Intubation Date/Time: 02/07/2013 8:38 AM Performed by: Lovie Chol Pre-anesthesia Checklist: Patient identified, Emergency Drugs available, Suction available and Timeout performed Patient Re-evaluated:Patient Re-evaluated prior to inductionOxygen Delivery Method: Circle system utilized Preoxygenation: Pre-oxygenation with 100% oxygen Intubation Type: IV induction Ventilation: Mask ventilation without difficulty Laryngoscope Size: 2 Grade View: Grade I Tube type: Oral Tube size: 7.5 mm Number of attempts: 1 Airway Equipment and Method: Stylet Placement Confirmation: ETT inserted through vocal cords under direct vision,  positive ETCO2,  CO2 detector and breath sounds checked- equal and bilateral Secured at: 21 cm Tube secured with: Tape Dental Injury: Teeth and Oropharynx as per pre-operative assessment

## 2013-02-07 NOTE — Transfer of Care (Signed)
Immediate Anesthesia Transfer of Care Note  Patient: Annette Mccullough  Procedure(s) Performed: Procedure(s) with comments: REMOVAL OF HARDWARE (BATTERY REMOVAL) (Left) - removal of spinal cord stimulator battery  Patient Location: PACU  Anesthesia Type:General  Level of Consciousness: awake, alert , oriented and patient cooperative  Airway & Oxygen Therapy: Patient Spontanous Breathing and Patient connected to nasal cannula oxygen  Post-op Assessment: Report given to PACU RN and Post -op Vital signs reviewed and stable  Post vital signs: Reviewed and stable  Complications: No apparent anesthesia complications

## 2013-02-07 NOTE — Anesthesia Postprocedure Evaluation (Signed)
  Anesthesia Post-op Note  Patient: Annette Mccullough  Procedure(s) Performed: Procedure(s) with comments: REMOVAL OF HARDWARE (BATTERY REMOVAL) (Left) - removal of spinal cord stimulator battery  Patient Location: PACU  Anesthesia Type:General  Level of Consciousness: awake, oriented and patient cooperative  Airway and Oxygen Therapy: Patient Spontanous Breathing  Post-op Pain: mild  Post-op Assessment: Post-op Vital signs reviewed, Patient's Cardiovascular Status Stable, Respiratory Function Stable, Patent Airway, No signs of Nausea or vomiting and Pain level controlled  Post-op Vital Signs: stable  Complications: No apparent anesthesia complications

## 2013-02-07 NOTE — Addendum Note (Signed)
Addendum created 02/07/13 1038 by Lovie Chol, CRNA   Modules edited: Anesthesia Flowsheet

## 2013-02-07 NOTE — Preoperative (Signed)
Beta Blockers   Reason not to administer Beta Blockers:Not Applicable 

## 2013-02-07 NOTE — Brief Op Note (Signed)
02/07/2013  9:20 AM  PATIENT:  Annette Mccullough  67 y.o. female  PRE-OPERATIVE DIAGNOSIS:  SYMPTOMATIC HARDWARE  POST-OPERATIVE DIAGNOSIS:  SYMPTOMATIC HARDWARE  PROCEDURE:  Procedure(s) with comments: REMOVAL OF HARDWARE (BATTERY REMOVAL) (Left) - removal of spinal cord stimulator battery  SURGEON:  Surgeon(s) and Role:    * Venita Lick, MD - Primary  PHYSICIAN ASSISTANT:   ASSISTANTS: none   ANESTHESIA:   general  EBL:  Total I/O In: 500 [I.V.:500] Out: -   BLOOD ADMINISTERED:none  DRAINS: none   LOCAL MEDICATIONS USED:  MARCAINE     SPECIMEN:  No Specimen  DISPOSITION OF SPECIMEN:  N/A  COUNTS:  YES  TOURNIQUET:  * No tourniquets in log *  DICTATION: .Other Dictation: Dictation Number [id number]  PLAN OF CARE: Discharge to home after PACU  PATIENT DISPOSITION:  PACU - hemodynamically stable.

## 2013-02-07 NOTE — Anesthesia Preprocedure Evaluation (Addendum)
Anesthesia Evaluation  Patient identified by MRN, date of birth, ID band Patient awake    Reviewed: Allergy & Precautions, H&P , NPO status , Patient's Chart, lab work & pertinent test results  History of Anesthesia Complications Negative for: history of anesthetic complications  Airway Mallampati: III TM Distance: >3 FB Neck ROM: full    Dental  (+) Teeth Intact, Dental Advisory Given and Edentulous Upper   Pulmonary          Cardiovascular hypertension, Pt. on medications and Pt. on home beta blockers Rhythm:regular Rate:Normal     Neuro/Psych PSYCHIATRIC DISORDERS Depression  Neuromuscular disease CVA    GI/Hepatic negative GI ROS, Neg liver ROS,   Endo/Other  Hypothyroidism   Renal/GU CRFRenal disease     Musculoskeletal  (+) Fibromyalgia -  Abdominal   Peds  Hematology   Anesthesia Other Findings   Reproductive/Obstetrics                          Anesthesia Physical Anesthesia Plan  ASA: III  Anesthesia Plan: General   Post-op Pain Management:    Induction: Intravenous  Airway Management Planned: Oral ETT  Additional Equipment:   Intra-op Plan:   Post-operative Plan: Extubation in OR  Informed Consent: I have reviewed the patients History and Physical, chart, labs and discussed the procedure including the risks, benefits and alternatives for the proposed anesthesia with the patient or authorized representative who has indicated his/her understanding and acceptance.     Plan Discussed with: CRNA, Anesthesiologist and Surgeon  Anesthesia Plan Comments:         Anesthesia Quick Evaluation

## 2013-02-07 NOTE — Progress Notes (Signed)
02/07/13 0730  OBSTRUCTIVE SLEEP APNEA  Have you ever been diagnosed with sleep apnea through a sleep study? No  Do you snore loudly (loud enough to be heard through closed doors)?  0  Do you often feel tired, fatigued, or sleepy during the daytime? 0  Has anyone observed you stop breathing during your sleep? 0  Do you have, or are you being treated for high blood pressure? 1  BMI more than 35 kg/m2? 1  Age over 67 years old? 1  Neck circumference greater than 40 cm/18 inches? 1  Gender: 0  Obstructive Sleep Apnea Score 4  Score 4 or greater  Results sent to PCP

## 2013-02-08 NOTE — Op Note (Signed)
NAME:  Annette Mccullough, Annette Mccullough NO.:  [id number]  MEDICAL RECORD NO.:  [id number]  LOCATION:  [facility]                         FACILITY:  [facility]  PHYSICIAN:  Alvy Beal, MD    DATE OF BIRTH:  05/28/1946  DATE OF PROCEDURE:  02/07/2013 DATE OF DISCHARGE:  02/07/2013                              OPERATIVE REPORT   PREOPERATIVE DIAGNOSIS:  Symptomatic spinal cord stimulator battery (symptomatic hardware).  POSTOPERATIVE DIAGNOSE:  Symptomatic spinal cord stimulator battery (symptomatic hardware).  OPERATIVE PROCEDURE:  Removal of hardware.  COMPLICATIONS:  None.  CONDITION:  Stable.  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  It did well for a prolonged period of time and then the battery failed to function properly.  She also noted that when she attempted to recharge the battery, it would heat up and cause her pain.  As a result, she stopped using it.  The battery itself was now causing localized pain and tenderness, and she has expressed desire to have it removed.  All appropriate risks, benefits, and alternatives to surgery were discussed and the consent was obtained.  OPERATIVE PROCEDURE:  The patient was brought to the operating room and placed supine on the operating table.  After successful induction of general anesthesia and endotracheal intubation, TEDs, SCDs were applied. She was placed supine on the beanbag and then turned into the lazy lateral position.  The left gluteal region where the battery was prepped and draped in a standard fashion.  Time-out was done to confirm patient, procedure, and all other pertinent important data.  Once this was completed, the previous incision site was infiltrated with a total of 20 mL of Marcaine, and I then incised the previous incision and sharply dissected down.  I then was able to palpate the battery itself.  I then delivered the battery out of the  pocket and disengage the wires.  I then irrigated the site copiously with normal saline.  I obtained hemostasis and then closed the deep fascia with a #1 Vicryl suture, superficial 2-0 interrupted Vicryl sutures, and 3-0 Monocryl for the skin.  Steri-Strips and dry dressing were applied.  The patient was extubated and transferred to the PACU without incident.  At the end of the case, all needle and sponge counts were correct.  There was no adverse intraoperative event.     Alvy Beal, MD     DDB/MEDQ  D:  02/07/2013  T:  02/08/2013  Job:  [id number]

## 2013-02-09 ENCOUNTER — Encounter (HOSPITAL_COMMUNITY): Payer: Self-pay | Admitting: Orthopedic Surgery

## 2013-02-19 ENCOUNTER — Ambulatory Visit: Payer: Self-pay

## 2016-01-08 ENCOUNTER — Ambulatory Visit: Payer: Self-pay | Admitting: Orthopedic Surgery

## 2016-01-08 NOTE — Progress Notes (Signed)
Preoperative surgical orders have been place into the Epic hospital system for Annette Mccullough on 01/08/2016, 3:19 PM  by Mickel Crow for surgery on 01-21-16.  Preop Total Knee orders including Experal, IV Tylenol, and IV Decadron as long as there are no contraindications to the above medications. Arlee Muslim, PA-C

## 2016-01-12 ENCOUNTER — Encounter (HOSPITAL_COMMUNITY): Payer: Self-pay

## 2016-01-12 NOTE — Patient Instructions (Addendum)
Annette Mccullough  01/12/2016   Your procedure is scheduled on: 01/21/2016   Report to [facility] Main  Entrance take [facility]  elevators to 3rd floor to  [place] at    150pm  Call this number if you have problems the morning of surgery [phone]   Remember: ONLY 1 PERSON MAY GO WITH YOU TO SHORT STAY TO GET  READY MORNING OF YOUR SURGERY.  Do not eat food after midnite.  May have clear liquids from 12 midnite until 0800am then nothing by mouth.       Take these medicines the morning of surgery with A SIP OF WATER: Cymbalta, eye drops , claritin if needed, Percocet , Toprol                                 You may not have any metal on your body including hair pins and              piercings  Do not wear jewelry, make-up, lotions, powders or perfumes, deodorant             Do not wear nail polish.  Do not shave  48 hours prior to surgery.                Do not bring valuables to the hospital. [place].  Contacts, dentures or bridgework may not be worn into surgery.  Leave suitcase in the car. After surgery it may be brought to your room.     Patients discharged the day of surgery will not be allowed to drive home.  Name and phone number of your driver:  Special Instructions: coughing and deep breathing exercises, leg exercises               Please read over the following fact sheets you were given: _____________________________________________________________________             [facility] - Preparing for Surgery Before surgery, you can play an important role.  Because skin is not sterile, your skin needs to be as free of germs as possible.  You can reduce the number of germs on your skin by washing with CHG (chlorahexidine gluconate) soap before surgery.  CHG is an antiseptic cleaner which kills germs and bonds with the skin to continue killing germs even after washing. Please DO NOT use if you  have an allergy to CHG or antibacterial soaps.  If your skin becomes reddened/irritated stop using the CHG and inform your nurse when you arrive at Short Stay. Do not shave (including legs and underarms) for at least 48 hours prior to the first CHG shower.  You may shave your face/neck. Please follow these instructions carefully:  1.  Shower with CHG Soap the night before surgery and the  morning of Surgery.  2.  If you choose to wash your hair, wash your hair first as usual with your  normal  shampoo.  3.  After you shampoo, rinse your hair and body thoroughly to remove the  shampoo.                           4.  Use CHG as you would  any other liquid soap.  You can apply chg directly  to the skin and wash                       Gently with a scrungie or clean washcloth.  5.  Apply the CHG Soap to your body ONLY FROM THE NECK DOWN.   Do not use on face/ open                           Wound or open sores. Avoid contact with eyes, ears mouth and genitals (private parts).                       Wash face,  Genitals (private parts) with your normal soap.             6.  Wash thoroughly, paying special attention to the area where your surgery  will be performed.  7.  Thoroughly rinse your body with warm water from the neck down.  8.  DO NOT shower/wash with your normal soap after using and rinsing off  the CHG Soap.                9.  Pat yourself dry with a clean towel.            10.  Wear clean pajamas.            11.  Place clean sheets on your bed the night of your first shower and do not  sleep with pets. Day of Surgery : Do not apply any lotions/deodorants the morning of surgery.  Please wear clean clothes to the hospital/surgery center.  FAILURE TO FOLLOW THESE INSTRUCTIONS MAY RESULT IN THE CANCELLATION OF YOUR SURGERY PATIENT SIGNATURE_________________________________  NURSE  SIGNATURE__________________________________  ________________________________________________________________________    CLEAR LIQUID DIET   Foods Allowed                                                                     Foods Excluded  Coffee and tea, regular and decaf                             liquids that you cannot  Plain Jell-O in any flavor                                             see through such as: Fruit ices (not with fruit pulp)                                     milk, soups, orange juice  Iced Popsicles                                    All solid food Carbonated beverages, regular and diet  Cranberry, grape and apple juices Sports drinks like Gatorade Lightly seasoned clear broth or consume(fat free) Sugar, honey syrup  Sample Menu Breakfast                                Lunch                                     Supper Cranberry juice                    Beef broth                            Chicken broth Jell-O                                     Grape juice                           Apple juice Coffee or tea                        Jell-O                                      Popsicle                                                Coffee or tea                        Coffee or tea  _____________________________________________________________________   WHAT IS A BLOOD TRANSFUSION? Blood Transfusion Information  A transfusion is the replacement of blood or some of its parts. Blood is made up of multiple cells which provide different functions.  Red blood cells carry oxygen and are used for blood loss replacement.  White blood cells fight against infection.  Platelets control bleeding.  Plasma helps clot blood.  Other blood products are available for specialized needs, such as hemophilia or other clotting disorders. BEFORE THE TRANSFUSION  Who gives blood for transfusions?   Healthy volunteers who are fully evaluated  to make sure their blood is safe. This is blood bank blood. Transfusion therapy is the safest it has ever been in the practice of medicine. Before blood is taken from a donor, a complete history is taken to make sure that person has no history of diseases nor engages in risky social behavior (examples are intravenous drug use or sexual activity with multiple partners). The donor's travel history is screened to minimize risk of transmitting infections, such as malaria. The donated blood is tested for signs of infectious diseases, such as HIV and hepatitis. The blood is then tested to be sure it is compatible with you in order to minimize the chance of a transfusion reaction. If you or a relative donates blood, this is often done in anticipation of surgery and is not appropriate for emergency situations. It takes many days to process the donated blood.  RISKS AND COMPLICATIONS Although transfusion therapy is very safe and saves many lives, the main dangers of transfusion include:  1. Getting an infectious disease. 2. Developing a transfusion reaction. This is an allergic reaction to something in the blood you were given. Every precaution is taken to prevent this. The decision to have a blood transfusion has been considered carefully by your caregiver before blood is given. Blood is not given unless the benefits outweigh the risks. AFTER THE TRANSFUSION  Right after receiving a blood transfusion, you will usually feel much better and more energetic. This is especially true if your red blood cells have gotten low (anemic). The transfusion raises the level of the red blood cells which carry oxygen, and this usually causes an energy increase.  The nurse administering the transfusion will monitor you carefully for complications. HOME CARE INSTRUCTIONS  No special instructions are needed after a transfusion. You may find your energy is better. Speak with your caregiver about any limitations on activity for  underlying diseases you may have. SEEK MEDICAL CARE IF:   Your condition is not improving after your transfusion.  You develop redness or irritation at the intravenous (IV) site. SEEK IMMEDIATE MEDICAL CARE IF:  Any of the following symptoms occur over the next 12 hours:  Shaking chills.  You have a temperature by mouth above 102 F (38.9 C), not controlled by medicine.  Chest, back, or muscle pain.  People around you feel you are not acting correctly or are confused.  Shortness of breath or difficulty breathing.  Dizziness and fainting.  You get a rash or develop hives.  You have a decrease in urine output.  Your urine turns a dark color or changes to pink, red, or brown. Any of the following symptoms occur over the next 10 days:  You have a temperature by mouth above 102 F (38.9 C), not controlled by medicine.  Shortness of breath.  Weakness after normal activity.  The white part of the eye turns yellow (jaundice).  You have a decrease in the amount of urine or are urinating less often.  Your urine turns a dark color or changes to pink, red, or brown. Document Released: 09/17/2000 Document Revised: 12/13/2011 Document Reviewed: 05/06/2008 ExitCare Patient Information 2014 [place].  _______________________________________________________________________  Incentive Spirometer  An incentive spirometer is a tool that can help keep your lungs clear and active. This tool measures how well you are filling your lungs with each breath. Taking long deep breaths may help reverse or decrease the chance of developing breathing (pulmonary) problems (especially infection) following:  A long period of time when you are unable to move or be active. BEFORE THE PROCEDURE   If the spirometer includes an indicator to show your best effort, your nurse or respiratory therapist will set it to a desired goal.  If possible, sit up straight or lean slightly forward. Try not to  slouch.  Hold the incentive spirometer in an upright position. INSTRUCTIONS FOR USE  3. Sit on the edge of your bed if possible, or sit up as far as you can in bed or on a chair. 4. Hold the incentive spirometer in an upright position. 5. Breathe out normally. 6. Place the mouthpiece in your mouth and seal your lips tightly around it. 7. Breathe in slowly and as deeply as possible, raising the piston or the ball toward the top of the column. 8. Hold your breath for 3-5 seconds or for as long as possible. Allow the piston  or ball to fall to the bottom of the column. 9. Remove the mouthpiece from your mouth and breathe out normally. 10. Rest for a few seconds and repeat Steps 1 through 7 at least 10 times every 1-2 hours when you are awake. Take your time and take a few normal breaths between deep breaths. 11. The spirometer may include an indicator to show your best effort. Use the indicator as a goal to work toward during each repetition. 12. After each set of 10 deep breaths, practice coughing to be sure your lungs are clear. If you have an incision (the cut made at the time of surgery), support your incision when coughing by placing a pillow or rolled up towels firmly against it. Once you are able to get out of bed, walk around indoors and cough well. You may stop using the incentive spirometer when instructed by your caregiver.  RISKS AND COMPLICATIONS  Take your time so you do not get dizzy or light-headed.  If you are in pain, you may need to take or ask for pain medication before doing incentive spirometry. It is harder to take a deep breath if you are having pain. AFTER USE  Rest and breathe slowly and easily.  It can be helpful to keep track of a log of your progress. Your caregiver can provide you with a simple table to help with this. If you are using the spirometer at home, follow these instructions: [place] IF:   You are having difficultly using the  spirometer.  You have trouble using the spirometer as often as instructed.  Your pain medication is not giving enough relief while using the spirometer.  You develop fever of 100.5 F (38.1 C) or higher. SEEK IMMEDIATE MEDICAL CARE IF:   You cough up bloody sputum that had not been present before.  You develop fever of 102 F (38.9 C) or greater.  You develop worsening pain at or near the incision site. MAKE SURE YOU:   Understand these instructions.  Will watch your condition.  Will get help right away if you are not doing well or get worse. Document Released: 01/31/2007 Document Revised: 12/13/2011 Document Reviewed: 04/03/2007 [facility] Patient Information 2014 [place], [place].   ________________________________________________________________________

## 2016-01-13 ENCOUNTER — Encounter (HOSPITAL_COMMUNITY)
Admission: RE | Admit: 2016-01-13 | Discharge: 2016-01-13 | Disposition: A | Discharge status: Home / Self Care | Payer: Medicare Other | Source: Ambulatory Visit | Attending: Orthopedic Surgery | Admitting: Orthopedic Surgery

## 2016-01-13 ENCOUNTER — Encounter (HOSPITAL_COMMUNITY): Payer: Self-pay

## 2016-01-13 DIAGNOSIS — Z96651 Presence of right artificial knee joint: Secondary | ICD-10-CM | POA: Diagnosis not present

## 2016-01-13 DIAGNOSIS — Z01812 Encounter for preprocedural laboratory examination: Secondary | ICD-10-CM | POA: Diagnosis present

## 2016-01-13 HISTORY — DX: Family history of other specified conditions: Z84.89

## 2016-01-13 LAB — COMPREHENSIVE METABOLIC PANEL
ALT: 19 U/L (ref 14–54)
AST: 20 U/L (ref 15–41)
Albumin: 3.9 g/dL (ref 3.5–5.0)
Alkaline Phosphatase: 87 U/L (ref 38–126)
Anion gap: 9 (ref 5–15)
BUN: 23 mg/dL — ABNORMAL HIGH (ref 6–20)
CO2: 30 mmol/L (ref 22–32)
Calcium: 10.1 mg/dL (ref 8.9–10.3)
Chloride: 103 mmol/L (ref 101–111)
Creatinine, Ser: 0.76 mg/dL (ref 0.44–1.00)
GFR calc Af Amer: >60 mL/min (ref >60)
GFR calc non Af Amer: >60 mL/min (ref >60)
Glucose, Bld: 99 mg/dL (ref 65–99)
Potassium: 5.1 mmol/L (ref 3.5–5.1)
Sodium: 142 mmol/L (ref 135–145)
Total Bilirubin: 0.3 mg/dL (ref 0.3–1.2)
Total Protein: 7.3 g/dL (ref 6.5–8.1)

## 2016-01-13 LAB — URINALYSIS, ROUTINE W REFLEX MICROSCOPIC
Bilirubin Urine: NEGATIVE
Glucose, UA: NEGATIVE mg/dL
Hgb urine dipstick: NEGATIVE
Ketones, ur: NEGATIVE mg/dL
Nitrite: NEGATIVE
Protein, ur: NEGATIVE mg/dL
Specific Gravity, Urine: 1.027 (ref 1.005–1.030)
pH: 5.5 (ref 5.0–8.0)

## 2016-01-13 LAB — URINE MICROSCOPIC-ADD ON

## 2016-01-13 LAB — CBC
HCT: 37.7 % (ref 36.0–46.0)
Hemoglobin: 12.7 g/dL (ref 12.0–15.0)
MCH: 31.4 pg (ref 26.0–34.0)
MCHC: 33.7 g/dL (ref 30.0–36.0)
MCV: 93.3 fL (ref 78.0–100.0)
Platelets: 295 10*3/uL (ref 150–400)
RBC: 4.04 MIL/uL (ref 3.87–5.11)
RDW: 13.5 % (ref 11.5–15.5)
WBC: 9.4 10*3/uL (ref 4.0–10.5)

## 2016-01-13 LAB — ABO/RH: ABO/RH(D): A POS

## 2016-01-13 LAB — APTT: aPTT: 30 seconds (ref 24–37)

## 2016-01-13 LAB — PROTIME-INR
INR: 1.06 (ref 0.00–1.49)
Prothrombin Time: 14 seconds (ref 11.6–15.2)

## 2016-01-13 LAB — SURGICAL PCR SCREEN
MRSA, PCR: NEGATIVE
Staphylococcus aureus: NEGATIVE

## 2016-01-13 NOTE — Progress Notes (Signed)
[facility], U/A and micro results done 01/13/16 faxed via EPIC to Dr Wynelle Link.

## 2016-01-13 NOTE — Progress Notes (Signed)
Left message on voice mail of Annette Mccullough at [place] regarding patient stated at time of preop appointment that Dr Wynelle Link was supposed to remove cyst from right leg as well as right total knee revision.  The cyst removal was not listed on the consent form.  Asked for clarification and if so new consent order needs to be entered in EPIC.

## 2016-01-13 NOTE — Progress Notes (Signed)
Clearance - Dr Ola Spurr 11/03/15 on chart  LOV- 11/03/15 with Dr Ola Spurr on chart  Stress Test 11/21/15 with baseline EKG on chart

## 2016-01-14 ENCOUNTER — Ambulatory Visit: Payer: Self-pay | Admitting: Orthopedic Surgery

## 2016-01-14 NOTE — H&P (Signed)
Annette Mccullough DOB: 16-Jul-1946 Widowed / Language: Cleophus Molt / Race: White Female Date of Admission:  01/21/2016 CC:  Failed Right Total Knee History of Present Illness  The patient is a 70 year old female who comes in  for a preoperative History and Physical. The patient is scheduled for a right total knee arthroplasty (revision) to be performed by Dr. Dione Plover. Aluisio, MD at [facility] on 01-21-2016. The patient is a 70 year old female who presented with knee complaints. The patient was seen for a second opinion. The patient reports right knee symptoms including: pain, instability and soreness which began year(s) ago. The patient describes their pain as aching.The patient feels that the symptoms are worsening. Symptoms are reported to be located in the right posterior knee (with pain radiating up the back of her leg as well as to the buttock area. She also notes cramps and numbness in her right foot). Risk factors include total knee replacement (in October 1999, by Dr. Olivia Mackie in [place]. She states that he is now incarcerated, so she has been unable to obtain records). Note for "Knee pain": Patient is currently in pain management for chronic back pain. She has had problems with her knee for close to a year now. Pain is about 5 to 6 cm below the knee in the anterior tibia. It is worse with weightbearing, but still occurring some at rest. She is not having any significant swelling in the knee. She is now almost 17 years out from a total knee arthroplasty. She had done well until now. Did not have any problems with infection or other periprosthetic abnormalities. She is having a lot of low back pain. That has been chronic. She does not get any anterior hip pain or groin pain. AP of both knees, lateral of the right show a cementless prosthesis. She has got a tibial base plate with screws in it. Unfortunately, at the bottom of the screws is a large cystic lesion, which is somewhat thinning the  posterior cortex of the tibia. 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 At this point, the most predictable means of improving pain and function is revision total knee arthroplasty. The procedure, risks, potential complications and rehab course are discussed in detail and the patient elects to proceed. Although she is not hurting in the knee itself, she is hurting around the area where the cyst is. 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 The only mechanism to solve this is to revise the knee. Replacing this with a new polyethylene does not address the root cause of this problem. We discussed this in detail. In order to fix this, she is going to need a total knee arthroplasty revision. I feel it is medically necessary to do this as the osteolysis is going to get worse and lead to a fracture in the future. They have been treated conservatively in the past for the above stated problem and despite conservative measures, they continue to have progressive pain and severe functional limitations and dysfunction. They have failed non-operative management. It is felt that they would benefit from undergoing revision total joint replacement. Risks and benefits of the procedure have been discussed with the patient and they elect to proceed with surgery. There are no active contraindications to surgery such as ongoing infection or rapidly progressive neurological disease.  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 Latex  Eggs  Yolk only  Family History Hypertension  father Drug / Alcohol Addiction  mother and father Cancer  mother, father, sister and brother  Social History Number of flights of stairs before winded  less than 1 Marital status  widowed Tobacco / smoke exposure  no Pain Contract  yes Illicit drug use  no Current work status  disabled Children  4 Alcohol use  current drinker; drinks wine; only occasionally per week Exercise  Exercises never Drug/Alcohol Rehab (Previously)  no Drug/Alcohol Rehab (Currently)  no Tobacco use  former smoker; smoke(d) 1/2 pack(s) per day Living situation  live alone Post-Surgical Plans  She wants to look into a SNF. RAPT Score of 5.  Medication History Enalapril Maleate (20MG  Tablet, Oral) Active. Metoprolol Tartrate (25MG  Tablet, Oral) Active. Hydrochlorothiazide (25MG  Tablet, Oral) Active. Aspirin EC (81MG  Tablet DR, Oral) Active. Cymbalta (60MG  Capsule DR Part, Oral) Active. Cyclobenzaprine HCl (10MG  Tablet, Oral) Active. (1 in the AM; 2 qhs) Percocet (10-325MG  Tablet, Oral) Active. Latanoprost (0.005% Solution, Ophthalmic) Active.   Past Surgical History  Back Surgery  SCS Spinal Fusion  neck Neck Disc Surgery  Total Knee Replacement  Date: 07/1998. right Tonsillectomy  Hysterectomy  complete (non-cancerous) Arthroscopy of Knee  right Appendectomy  Dilation and Curettage of Uterus - Multiple  Cesarean Delivery  2 times   Review of Systems General Not Present- Chills, Fatigue, Fever, Memory Loss, Night Sweats, Weight Gain and Weight Loss. Skin Not Present- Eczema, Hives, Itching, Lesions and Rash. HEENT Not Present- Dentures, Double Vision, Headache, Hearing Loss, Tinnitus and Visual Loss. Respiratory Not Present- Allergies, Chronic Cough,  Coughing up blood, Shortness of breath at rest and Shortness of breath with exertion. Cardiovascular Not Present- Chest Pain, Difficulty Breathing Lying Down, Murmur, Palpitations, Racing/skipping heartbeats and Swelling. Gastrointestinal Not Present- Abdominal Pain, Bloody Stool, Constipation, Diarrhea, Difficulty Swallowing, Heartburn, Jaundice, Loss of appetitie, Nausea and Vomiting. Female Genitourinary Not Present- Blood in Urine, Discharge, Flank Pain, Incontinence, Painful Urination, Urgency, Urinary frequency, Urinary Retention, Urinating at Night and Weak urinary stream. Musculoskeletal Present- Back Pain, Morning Stiffness and Muscle Weakness. Not Present- Joint Pain, Joint Swelling, Muscle Pain and Spasms. Neurological Not Present- Blackout spells, Difficulty with balance, Dizziness, Paralysis, Tremor and Weakness. Psychiatric Not Present- Insomnia.  Vitals  Weight: 260 lb Height: 67in Body Surface Area: 2.26 m Body Mass Index: 40.72 kg/m  Pulse: 72 (Regular)  BP: 188/64 (Sitting, Right Arm, Standard)   Physical Exam  General Mental Status -Alert, cooperative and good historian. General Appearance-pleasant, Not in acute distress. Orientation-Oriented X3. Build & Nutrition-Well nourished and Well developed.  Head and Neck Head-normocephalic, atraumatic . Neck Global Assessment - supple, no bruit auscultated on the right, no bruit auscultated on the left.  Eye Vision-Wears corrective lenses. Pupil - Bilateral-Regular and Round. Motion - Bilateral-EOMI.  ENMT Note: Upper denture plate   Chest and Lung Exam Auscultation Breath sounds - clear at anterior chest wall and clear at posterior chest wall. Adventitious sounds - No Adventitious  sounds.  Cardiovascular Auscultation Rhythm - Regular rate and rhythm. Heart Sounds - S1 WNL and S2 WNL. Murmurs & Other Heart Sounds - Auscultation of the heart reveals - No  Murmurs.  Abdomen Inspection Contour - Generalized moderate distention. Palpation/Percussion Tenderness - Abdomen is non-tender to palpation. Rigidity (guarding) - Abdomen is soft. Auscultation Auscultation of the abdomen reveals - Bowel sounds normal.  Female Genitourinary Note: Not done, not pertinent to present illness   Musculoskeletal Note: On exam, she is a well-developed female, alert and oriented, in no apparent distress. Her right hip has normal range of motion, no discomfort. Her right knee shows no effusion. Her range is about 0 to 115. There is no instability. She is very tender in the proximal tibia about 5 to 6 cm below the joint line. Some slight residual weakness with the left upper and lower extremity due to previous stroke.  RADIOGRAPHS AP both knees, lateral of the right show a cementless prosthesis. She has got a tibial base plate with screws in it. Unfortunately, at the bottom of the screws is a large cystic lesion, which is somewhat thinning the posterior cortex of the tibia.  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 Ola Spurr - Patient has been seen preoperatively and felt to be stable for surgery.  Topical TXA - CVA  Anesthesia Issues: None  Signed electronically by Ok Edwards, III PA-C

## 2016-01-20 MED ORDER — CEFAZOLIN SODIUM 10 G IJ SOLR
3.0000 g | INTRAMUSCULAR | Status: AC
  Administered 2016-01-21: 3 g via INTRAVENOUS
  Filled 2016-01-20: qty 3000

## 2016-01-21 ENCOUNTER — Inpatient Hospital Stay (HOSPITAL_COMMUNITY): Payer: Medicare Other | Admitting: Anesthesiology

## 2016-01-21 ENCOUNTER — Inpatient Hospital Stay (HOSPITAL_COMMUNITY)
Admission: RE | Admit: 2016-01-21 | Discharge: 2016-01-24 | DRG: 467 | Disposition: A | Discharge status: Skilled Nursing Facility | Payer: Medicare Other | Source: Ambulatory Visit | Attending: Orthopedic Surgery | Admitting: Orthopedic Surgery

## 2016-01-21 ENCOUNTER — Encounter (HOSPITAL_COMMUNITY): Payer: Self-pay

## 2016-01-21 ENCOUNTER — Encounter (HOSPITAL_COMMUNITY)
Admission: RE | Disposition: A | Discharge status: Skilled Nursing Facility | Payer: Self-pay | Source: Ambulatory Visit | Attending: Orthopedic Surgery

## 2016-01-21 DIAGNOSIS — T84012A Broken internal right knee prosthesis, initial encounter: Secondary | ICD-10-CM

## 2016-01-21 DIAGNOSIS — M25561 Pain in right knee: Secondary | ICD-10-CM | POA: Diagnosis present

## 2016-01-21 DIAGNOSIS — I10 Essential (primary) hypertension: Secondary | ICD-10-CM | POA: Diagnosis present

## 2016-01-21 DIAGNOSIS — Z981 Arthrodesis status: Secondary | ICD-10-CM

## 2016-01-21 DIAGNOSIS — Z6841 Body Mass Index (BMI) 40.0 and over, adult: Secondary | ICD-10-CM

## 2016-01-21 DIAGNOSIS — Y792 Prosthetic and other implants, materials and accessory orthopedic devices associated with adverse incidents: Secondary | ICD-10-CM | POA: Diagnosis present

## 2016-01-21 DIAGNOSIS — Z01812 Encounter for preprocedural laboratory examination: Secondary | ICD-10-CM | POA: Diagnosis not present

## 2016-01-21 DIAGNOSIS — Z87891 Personal history of nicotine dependence: Secondary | ICD-10-CM

## 2016-01-21 DIAGNOSIS — T84032A Mechanical loosening of internal right knee prosthetic joint, initial encounter: Principal | ICD-10-CM | POA: Diagnosis present

## 2016-01-21 DIAGNOSIS — M797 Fibromyalgia: Secondary | ICD-10-CM | POA: Diagnosis present

## 2016-01-21 DIAGNOSIS — I69354 Hemiplegia and hemiparesis following cerebral infarction affecting left non-dominant side: Secondary | ICD-10-CM

## 2016-01-21 DIAGNOSIS — M179 Osteoarthritis of knee, unspecified: Secondary | ICD-10-CM | POA: Diagnosis present

## 2016-01-21 DIAGNOSIS — M1710 Unilateral primary osteoarthritis, unspecified knee: Secondary | ICD-10-CM | POA: Diagnosis present

## 2016-01-21 HISTORY — PX: TOTAL KNEE REVISION: SHX996

## 2016-01-21 LAB — TYPE AND SCREEN
ABO/RH(D): A POS
Antibody Screen: NEGATIVE

## 2016-01-21 SURGERY — TOTAL KNEE REVISION
Anesthesia: General | Site: Knee | Laterality: Right

## 2016-01-21 MED ORDER — POLYETHYLENE GLYCOL 3350 17 G PO PACK: 17.0000 g | PACK | Freq: Every day | ORAL | Status: DC | PRN

## 2016-01-21 MED ORDER — MORPHINE SULFATE (PF) 2 MG/ML IV SOLN
1.0000 mg | INTRAVENOUS | Status: DC | PRN
  Administered 2016-01-21 – 2016-01-22 (×2): 1 mg via INTRAVENOUS
  Filled 2016-01-21 (×2): qty 1

## 2016-01-21 MED ORDER — HYDROMORPHONE HCL 2 MG/ML IJ SOLN
INTRAMUSCULAR | Status: AC
  Filled 2016-01-21: qty 1

## 2016-01-21 MED ORDER — DEXAMETHASONE SODIUM PHOSPHATE 10 MG/ML IJ SOLN
10.0000 mg | Freq: Once | INTRAMUSCULAR | Status: AC
  Administered 2016-01-22: 10 mg via INTRAVENOUS
  Filled 2016-01-21: qty 1

## 2016-01-21 MED ORDER — SODIUM CHLORIDE 0.9 % IJ SOLN
INTRAMUSCULAR | Status: DC | PRN
  Administered 2016-01-21: 30 mL

## 2016-01-21 MED ORDER — ACETAMINOPHEN 500 MG PO TABS
1000.0000 mg | ORAL_TABLET | Freq: Four times a day (QID) | ORAL | Status: AC
  Administered 2016-01-21 – 2016-01-22 (×4): 1000 mg via ORAL
  Filled 2016-01-21 (×5): qty 2

## 2016-01-21 MED ORDER — MIDAZOLAM HCL 2 MG/2ML IJ SOLN
INTRAMUSCULAR | Status: AC
  Filled 2016-01-21: qty 2

## 2016-01-21 MED ORDER — PROPOFOL 10 MG/ML IV BOLUS
INTRAVENOUS | Status: AC
Start: 2016-01-21 — End: 2016-01-21
  Filled 2016-01-21: qty 40

## 2016-01-21 MED ORDER — HYDROMORPHONE HCL 1 MG/ML IJ SOLN
0.2500 mg | INTRAMUSCULAR | Status: AC | PRN
  Administered 2016-01-21 (×3): 0.25 mg via INTRAVENOUS
  Administered 2016-01-21: 0.5 mg via INTRAVENOUS
  Administered 2016-01-21 (×2): 0.25 mg via INTRAVENOUS
  Administered 2016-01-21 (×2): 0.5 mg via INTRAVENOUS
  Administered 2016-01-21: 0.25 mg via INTRAVENOUS

## 2016-01-21 MED ORDER — HYDROMORPHONE HCL 1 MG/ML IJ SOLN
INTRAMUSCULAR | Status: AC
  Filled 2016-01-21: qty 1

## 2016-01-21 MED ORDER — ACETAMINOPHEN 650 MG RE SUPP: 650.0000 mg | Freq: Four times a day (QID) | RECTAL | Status: DC | PRN

## 2016-01-21 MED ORDER — HYDROMORPHONE HCL 1 MG/ML IJ SOLN
INTRAMUSCULAR | Status: DC | PRN
  Administered 2016-01-21 (×4): 0.5 mg via INTRAVENOUS

## 2016-01-21 MED ORDER — LACTATED RINGERS IV SOLN: INTRAVENOUS | Status: DC

## 2016-01-21 MED ORDER — HYDROCHLOROTHIAZIDE 25 MG PO TABS
25.0000 mg | ORAL_TABLET | Freq: Every morning | ORAL | Status: DC
  Administered 2016-01-22 – 2016-01-24 (×3): 25 mg via ORAL
  Filled 2016-01-21 (×3): qty 1

## 2016-01-21 MED ORDER — FLEET ENEMA 7-19 GM/118ML RE ENEM: 1.0000 | ENEMA | Freq: Once | RECTAL | Status: DC | PRN

## 2016-01-21 MED ORDER — DEXAMETHASONE SODIUM PHOSPHATE 10 MG/ML IJ SOLN
INTRAMUSCULAR | Status: AC
  Filled 2016-01-21: qty 1

## 2016-01-21 MED ORDER — NEOSTIGMINE METHYLSULFATE 10 MG/10ML IV SOLN
INTRAVENOUS | Status: DC | PRN
  Administered 2016-01-21: 4 mg via INTRAVENOUS

## 2016-01-21 MED ORDER — ONDANSETRON HCL 4 MG/2ML IJ SOLN: 4.0000 mg | Freq: Four times a day (QID) | INTRAMUSCULAR | Status: DC | PRN

## 2016-01-21 MED ORDER — BUPIVACAINE HCL 0.25 % IJ SOLN
INTRAMUSCULAR | Status: DC | PRN
  Administered 2016-01-21: 30 mL

## 2016-01-21 MED ORDER — CEFAZOLIN SODIUM-DEXTROSE 2-4 GM/100ML-% IV SOLN
2.0000 g | Freq: Four times a day (QID) | INTRAVENOUS | Status: AC
  Administered 2016-01-21 – 2016-01-22 (×2): 2 g via INTRAVENOUS
  Filled 2016-01-21 (×2): qty 100

## 2016-01-21 MED ORDER — BUPIVACAINE LIPOSOME 1.3 % IJ SUSP
INTRAMUSCULAR | Status: DC | PRN
  Administered 2016-01-21: 20 mL

## 2016-01-21 MED ORDER — METHOCARBAMOL 1000 MG/10ML IJ SOLN
500.0000 mg | Freq: Four times a day (QID) | INTRAVENOUS | Status: DC | PRN
  Administered 2016-01-22: 500 mg via INTRAVENOUS
  Filled 2016-01-21 (×3): qty 5

## 2016-01-21 MED ORDER — MENTHOL 3 MG MT LOZG: 1.0000 | LOZENGE | OROMUCOSAL | Status: DC | PRN

## 2016-01-21 MED ORDER — LATANOPROST 0.005 % OP SOLN
1.0000 [drp] | Freq: Every day | OPHTHALMIC | Status: DC
  Administered 2016-01-21 – 2016-01-23 (×3): 1 [drp] via OPHTHALMIC
  Filled 2016-01-21: qty 2.5

## 2016-01-21 MED ORDER — DIPHENHYDRAMINE HCL 12.5 MG/5ML PO ELIX: 12.5000 mg | ORAL_SOLUTION | ORAL | Status: DC | PRN

## 2016-01-21 MED ORDER — BISACODYL 10 MG RE SUPP: 10.0000 mg | Freq: Every day | RECTAL | Status: DC | PRN

## 2016-01-21 MED ORDER — SODIUM CHLORIDE 0.9 % IV SOLN
INTRAVENOUS | Status: DC
  Administered 2016-01-21: 21:00:00 via INTRAVENOUS

## 2016-01-21 MED ORDER — PHENOL 1.4 % MT LIQD: 1.0000 | OROMUCOSAL | Status: DC | PRN

## 2016-01-21 MED ORDER — LORATADINE 10 MG PO TABS
10.0000 mg | ORAL_TABLET | Freq: Every day | ORAL | Status: DC | PRN
  Filled 2016-01-21: qty 1

## 2016-01-21 MED ORDER — DEXAMETHASONE SODIUM PHOSPHATE 10 MG/ML IJ SOLN
10.0000 mg | Freq: Once | INTRAMUSCULAR | Status: AC
  Administered 2016-01-21: 10 mg via INTRAVENOUS

## 2016-01-21 MED ORDER — CHLORHEXIDINE GLUCONATE 4 % EX LIQD: 60.0000 mL | Freq: Once | CUTANEOUS | Status: DC

## 2016-01-21 MED ORDER — LIDOCAINE HCL (CARDIAC) 20 MG/ML IV SOLN
INTRAVENOUS | Status: DC | PRN
  Administered 2016-01-21: 60 mg via INTRAVENOUS

## 2016-01-21 MED ORDER — MIDAZOLAM HCL 5 MG/5ML IJ SOLN
INTRAMUSCULAR | Status: DC | PRN
  Administered 2016-01-21 (×2): 1 mg via INTRAVENOUS

## 2016-01-21 MED ORDER — ONDANSETRON HCL 4 MG/2ML IJ SOLN
INTRAMUSCULAR | Status: DC | PRN
Start: 2016-01-21 — End: 2016-01-21
  Administered 2016-01-21: 4 mg via INTRAVENOUS

## 2016-01-21 MED ORDER — 0.9 % SODIUM CHLORIDE (POUR BTL) OPTIME
TOPICAL | Status: DC | PRN
  Administered 2016-01-21: 1000 mL

## 2016-01-21 MED ORDER — SODIUM CHLORIDE 0.9 % IV SOLN
2000.0000 mg | INTRAVENOUS | Status: DC | PRN
  Administered 2016-01-21: 2000 mg via TOPICAL

## 2016-01-21 MED ORDER — DULOXETINE HCL 60 MG PO CPEP
60.0000 mg | ORAL_CAPSULE | Freq: Every day | ORAL | Status: DC
  Administered 2016-01-22 – 2016-01-24 (×3): 60 mg via ORAL
  Filled 2016-01-21 (×4): qty 1

## 2016-01-21 MED ORDER — BUPIVACAINE LIPOSOME 1.3 % IJ SUSP
20.0000 mL | Freq: Once | INTRAMUSCULAR | Status: DC
  Filled 2016-01-21: qty 20

## 2016-01-21 MED ORDER — TRANEXAMIC ACID 1000 MG/10ML IV SOLN
2000.0000 mg | Freq: Once | INTRAVENOUS | Status: DC
  Filled 2016-01-21: qty 20

## 2016-01-21 MED ORDER — OXYCODONE HCL 5 MG PO TABS
5.0000 mg | ORAL_TABLET | ORAL | Status: DC | PRN
  Administered 2016-01-21 – 2016-01-24 (×18): 10 mg via ORAL
  Filled 2016-01-21 (×18): qty 2

## 2016-01-21 MED ORDER — METHOCARBAMOL 500 MG PO TABS
500.0000 mg | ORAL_TABLET | Freq: Four times a day (QID) | ORAL | Status: DC | PRN
  Administered 2016-01-22 – 2016-01-24 (×8): 500 mg via ORAL
  Filled 2016-01-21 (×7): qty 1

## 2016-01-21 MED ORDER — METOCLOPRAMIDE HCL 5 MG/ML IJ SOLN: 5.0000 mg | Freq: Three times a day (TID) | INTRAMUSCULAR | Status: DC | PRN

## 2016-01-21 MED ORDER — SODIUM CHLORIDE 0.9 % IR SOLN
Status: DC | PRN
  Administered 2016-01-21: 1000 mL

## 2016-01-21 MED ORDER — ACETAMINOPHEN 325 MG PO TABS
650.0000 mg | ORAL_TABLET | Freq: Four times a day (QID) | ORAL | Status: DC | PRN
  Administered 2016-01-23: 650 mg via ORAL
  Filled 2016-01-21: qty 2

## 2016-01-21 MED ORDER — FENTANYL CITRATE (PF) 250 MCG/5ML IJ SOLN
INTRAMUSCULAR | Status: AC
  Filled 2016-01-21: qty 5

## 2016-01-21 MED ORDER — ETOMIDATE 2 MG/ML IV SOLN
INTRAVENOUS | Status: DC | PRN
  Administered 2016-01-21: 16 mg via INTRAVENOUS

## 2016-01-21 MED ORDER — METOCLOPRAMIDE HCL 10 MG PO TABS: 5.0000 mg | ORAL_TABLET | Freq: Three times a day (TID) | ORAL | Status: DC | PRN

## 2016-01-21 MED ORDER — ROCURONIUM BROMIDE 100 MG/10ML IV SOLN
INTRAVENOUS | Status: DC | PRN
  Administered 2016-01-21: 50 mg via INTRAVENOUS

## 2016-01-21 MED ORDER — DOCUSATE SODIUM 100 MG PO CAPS
100.0000 mg | ORAL_CAPSULE | Freq: Three times a day (TID) | ORAL | Status: DC
  Administered 2016-01-21 – 2016-01-24 (×8): 100 mg via ORAL

## 2016-01-21 MED ORDER — ACETAMINOPHEN 10 MG/ML IV SOLN
INTRAVENOUS | Status: AC
  Filled 2016-01-21: qty 100

## 2016-01-21 MED ORDER — BUPIVACAINE HCL (PF) 0.25 % IJ SOLN
INTRAMUSCULAR | Status: AC
  Filled 2016-01-21: qty 30

## 2016-01-21 MED ORDER — SODIUM CHLORIDE 0.9 % IJ SOLN
INTRAMUSCULAR | Status: AC
  Filled 2016-01-21: qty 50

## 2016-01-21 MED ORDER — LACTATED RINGERS IV SOLN
INTRAVENOUS | Status: DC | PRN
Start: 2016-01-21 — End: 2016-01-21
  Administered 2016-01-21 (×2): via INTRAVENOUS

## 2016-01-21 MED ORDER — FENTANYL CITRATE (PF) 250 MCG/5ML IJ SOLN
INTRAMUSCULAR | Status: DC | PRN
  Administered 2016-01-21: 50 ug via INTRAVENOUS
  Administered 2016-01-21: 100 ug via INTRAVENOUS
  Administered 2016-01-21 (×2): 50 ug via INTRAVENOUS

## 2016-01-21 MED ORDER — RIVAROXABAN 10 MG PO TABS
10.0000 mg | ORAL_TABLET | Freq: Every day | ORAL | Status: DC
  Administered 2016-01-22 – 2016-01-24 (×3): 10 mg via ORAL
  Filled 2016-01-21 (×4): qty 1

## 2016-01-21 MED ORDER — ACETAMINOPHEN 10 MG/ML IV SOLN
1000.0000 mg | Freq: Once | INTRAVENOUS | Status: AC
  Administered 2016-01-21: 1000 mg via INTRAVENOUS

## 2016-01-21 MED ORDER — METOPROLOL SUCCINATE ER 25 MG PO TB24
25.0000 mg | ORAL_TABLET | Freq: Two times a day (BID) | ORAL | Status: DC
  Administered 2016-01-22 – 2016-01-24 (×5): 25 mg via ORAL
  Filled 2016-01-21 (×7): qty 1

## 2016-01-21 MED ORDER — SODIUM CHLORIDE 0.9 % IV SOLN: INTRAVENOUS | Status: DC

## 2016-01-21 MED ORDER — ONDANSETRON HCL 4 MG/2ML IJ SOLN
INTRAMUSCULAR | Status: AC
  Filled 2016-01-21: qty 2

## 2016-01-21 MED ORDER — GLYCOPYRROLATE 0.2 MG/ML IJ SOLN
INTRAMUSCULAR | Status: DC | PRN
  Administered 2016-01-21: .7 mg via INTRAVENOUS

## 2016-01-21 MED ORDER — ONDANSETRON HCL 4 MG PO TABS: 4.0000 mg | ORAL_TABLET | Freq: Four times a day (QID) | ORAL | Status: DC | PRN

## 2016-01-21 SURGICAL SUPPLY — 52 items
ADAPTER BOLT FEMORAL +2/-2 (Knees) ×2 IMPLANT
BAG DECANTER FOR FLEXI CONT (MISCELLANEOUS) ×2 IMPLANT
BANDAGE ACE 6X5 VEL STRL LF (GAUZE/BANDAGES/DRESSINGS) ×2 IMPLANT
BLADE SAG 18X100X1.27 (BLADE) ×2 IMPLANT
BLADE SAW SGTL 11.0X1.19X90.0M (BLADE) ×2 IMPLANT
BONE CEMENT GENTAMICIN (Cement) ×6 IMPLANT
CEMENT BONE GENTAMICIN 40 (Cement) ×3 IMPLANT
CLOTH BEACON ORANGE TIMEOUT ST (SAFETY) ×2 IMPLANT
COMP FEM CEM RT SZ3 (Orthopedic Implant) ×2 IMPLANT
COMPONENT FEM CEM RT SZ3 (Orthopedic Implant) ×1 IMPLANT
CUFF TOURN SGL QUICK 34 (TOURNIQUET CUFF) ×1
CUFF TRNQT CYL 34X4X40X1 (TOURNIQUET CUFF) ×1 IMPLANT
DRAPE U-SHAPE 47X51 STRL (DRAPES) ×2 IMPLANT
DRSG ADAPTIC 3X8 NADH LF (GAUZE/BANDAGES/DRESSINGS) ×2 IMPLANT
DRSG PAD ABDOMINAL 8X10 ST (GAUZE/BANDAGES/DRESSINGS) ×2 IMPLANT
DURAPREP 26ML APPLICATOR (WOUND CARE) ×2 IMPLANT
ELECT REM PT RETURN 9FT ADLT (ELECTROSURGICAL) ×2
ELECTRODE REM PT RTRN 9FT ADLT (ELECTROSURGICAL) ×1 IMPLANT
EVACUATOR 1/8 PVC DRAIN (DRAIN) ×2 IMPLANT
FEMORAL ADAPTER (Orthopedic Implant) ×2 IMPLANT
GAUZE SPONGE 4X4 12PLY STRL (GAUZE/BANDAGES/DRESSINGS) ×2 IMPLANT
GLOVE BIOGEL PI IND STRL 8 (GLOVE) ×2 IMPLANT
GLOVE BIOGEL PI INDICATOR 8 (GLOVE) ×2
GLOVE SURG SS PI 8.0 STRL IVOR (GLOVE) ×4 IMPLANT
GOWN STRL REUS W/TWL LRG LVL3 (GOWN DISPOSABLE) ×2 IMPLANT
GOWN STRL REUS W/TWL XL LVL3 (GOWN DISPOSABLE) ×2 IMPLANT
HANDPIECE INTERPULSE COAX TIP (DISPOSABLE) ×1
IMMOBILIZER KNEE 20 (SOFTGOODS) ×2
IMMOBILIZER KNEE 20 THIGH 36 (SOFTGOODS) ×1 IMPLANT
INSERT TIBIAL TC3 15.0 (Knees) ×2 IMPLANT
MANIFOLD NEPTUNE II (INSTRUMENTS) ×2 IMPLANT
NS IRRIG 1000ML POUR BTL (IV SOLUTION) ×2 IMPLANT
PACK TOTAL KNEE CUSTOM (KITS) ×2 IMPLANT
PADDING CAST COTTON 6X4 STRL (CAST SUPPLIES) ×2 IMPLANT
PATELLA DOME PFC 38MM (Knees) ×2 IMPLANT
POSITIONER SURGICAL ARM (MISCELLANEOUS) ×2 IMPLANT
RESTRICTOR CEMENT SZ 5 C-STEM (Cement) ×2 IMPLANT
SET HNDPC FAN SPRY TIP SCT (DISPOSABLE) ×1 IMPLANT
STEM FLUTED UNIV REV 75X20 (Stem) ×2 IMPLANT
STEM TIBIA PFC 13X30MM (Stem) ×2 IMPLANT
SUT PDS AB 1 CT1 27 (SUTURE) ×2 IMPLANT
SUT VIC AB 2-0 CT1 27 (SUTURE) ×4
SUT VIC AB 2-0 CT1 TAPERPNT 27 (SUTURE) ×4 IMPLANT
SUT VLOC 180 0 24IN GS25 (SUTURE) ×2 IMPLANT
SYR 50ML LL SCALE MARK (SYRINGE) ×4 IMPLANT
TOWER CARTRIDGE SMART MIX (DISPOSABLE) ×2 IMPLANT
TRAY FOLEY BAG SILVER LF 16FR (SET/KITS/TRAYS/PACK) ×2 IMPLANT
TRAY REVISION SZ 3 (Knees) ×2 IMPLANT
TRAY SLEEVE CEM ML (Knees) ×2 IMPLANT
WATER STERILE IRR 1500ML POUR (IV SOLUTION) ×2 IMPLANT
WEDGE SZ 3 10MM (Knees) ×4 IMPLANT
WRAP KNEE MAXI GEL POST OP (GAUZE/BANDAGES/DRESSINGS) ×2 IMPLANT

## 2016-01-21 NOTE — Brief Op Note (Signed)
01/21/2016  5:12 PM  PATIENT:  Feliciana Rossetti  70 y.o. female  PRE-OPERATIVE DIAGNOSIS:  failed right total knee arthroplasty  POST-OPERATIVE DIAGNOSIS:  failed right total knee arthroplasty  PROCEDURE:  Procedure(s): RIGHT TOTAL KNEE REVISION (Right)  SURGEON:  Surgeon(s) and Role:    Gaynelle Arabian, MD - Primary  PHYSICIAN ASSISTANT:   ASSISTANTS: Molli Barrows, PA-C   ANESTHESIA:   general  EBL:  Total I/O In: 1000 [I.V.:1000] Out: 175 [Urine:100; Blood:75]  BLOOD ADMINISTERED:none  DRAINS: (Medium) Hemovact drain(s) in the right knee with  Suction Open   LOCAL MEDICATIONS USED:  OTHER Exparel  COUNTS:  YES  TOURNIQUET:   Total Tourniquet Time Documented: Thigh (Right) - 35 minutes Thigh (Right) - 25 minutes Total: Thigh (Right) - 60 minutes   DICTATION: .Other Dictation: Dictation Number [id number]  PLAN OF CARE: Admit to inpatient   PATIENT DISPOSITION:  PACU - hemodynamically stable.

## 2016-01-21 NOTE — Transfer of Care (Signed)
Immediate Anesthesia Transfer of Care Note  Patient: Annette Mccullough  Procedure(s) Performed: Procedure(s): RIGHT TOTAL KNEE REVISION (Right)  Patient Location: PACU  Anesthesia Type:General  Level of Consciousness: sedated  Airway & Oxygen Therapy: Patient Spontanous Breathing and Patient connected to face mask oxygen  Post-op Assessment: Report given to RN and Post -op Vital signs reviewed and stable  Post vital signs: Reviewed and stable  Last Vitals:  Filed Vitals:   01/21/16 1319  BP: 125/72  Pulse: 88  Temp: 36.4 C  Resp: 16    Complications: No apparent anesthesia complications

## 2016-01-21 NOTE — Anesthesia Preprocedure Evaluation (Addendum)
Anesthesia Evaluation  Patient identified by MRN, date of birth, ID band Patient awake    Reviewed: Allergy & Precautions, H&P , NPO status , Patient's Chart, lab work & pertinent test results, reviewed documented beta blocker date and time   Airway Mallampati: II  TM Distance: >3 FB Neck ROM: full    Dental  (+) Dental Advisory Given, Edentulous Upper   Pulmonary neg pulmonary ROS, former smoker,    Pulmonary exam normal breath sounds clear to auscultation       Cardiovascular Exercise Tolerance: Good hypertension, On Medications and On Home Beta Blockers negative cardio ROS Normal cardiovascular exam Rhythm:regular Rate:Normal     Neuro/Psych Spinal cord stimulator. Left side weakness post CVA. Cervical fusion  Neuromuscular disease CVA, Residual Symptoms negative psych ROS   GI/Hepatic negative GI ROS, Neg liver ROS,   Endo/Other  negative endocrine ROSMorbid obesity  Renal/GU negative Renal ROS  negative genitourinary   Musculoskeletal   Abdominal (+) + obese,   Peds  Hematology negative hematology ROS (+)   Anesthesia Other Findings   Reproductive/Obstetrics negative OB ROS                            Anesthesia Physical Anesthesia Plan  ASA: III  Anesthesia Plan: General   Post-op Pain Management:    Induction: Intravenous  Airway Management Planned: Oral ETT  Additional Equipment:   Intra-op Plan:   Post-operative Plan: Extubation in OR  Informed Consent: I have reviewed the patients History and Physical, chart, labs and discussed the procedure including the risks, benefits and alternatives for the proposed anesthesia with the patient or authorized representative who has indicated his/her understanding and acceptance.   Dental Advisory Given  Plan Discussed with: CRNA and Surgeon  Anesthesia Plan Comments:         Anesthesia Quick Evaluation

## 2016-01-21 NOTE — H&P (View-Only) (Signed)
Annette Mccullough DOB: [birth year] Widowed / Language: Cleophus Molt / Race: White Female Date of Admission:  01/21/2016 CC:  Failed Right Total Knee History of Present Illness  The patient is a 70 year old female who comes in  for a preoperative History and Physical. The patient is scheduled for a right total knee arthroplasty (revision) to be performed by Dr. Dione Plover. Aluisio, MD at [facility] on 01-21-2016. The patient is a 70 year old female who presented with knee complaints. The patient was seen for a second opinion. The patient reports right knee symptoms including: pain, instability and soreness which began year(s) ago. The patient describes their pain as aching.The patient feels that the symptoms are worsening. Symptoms are reported to be located in the right posterior knee (with pain radiating up the back of her leg as well as to the buttock area. She also notes cramps and numbness in her right foot). Risk factors include total knee replacement (in October 1999, by Dr. Olivia Mackie in [place]. She states that he is now incarcerated, so she has been unable to obtain records). Note for "Knee pain": Patient is currently in pain management for chronic back pain. She has had problems with her knee for close to a year now. Pain is about 5 to 6 cm below the knee in the anterior tibia. It is worse with weightbearing, but still occurring some at rest. She is not having any significant swelling in the knee. She is now almost 17 years out from a total knee arthroplasty. She had done well until now. Did not have any problems with infection or other periprosthetic abnormalities. She is having a lot of low back pain. That has been chronic. She does not get any anterior hip pain or groin pain. AP of both knees, lateral of the right show a cementless prosthesis. She has got a tibial base plate with screws in it. Unfortunately, at the bottom of the screws is a large cystic lesion, which is somewhat thinning the  posterior cortex of the tibia. 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 At this point, the most predictable means of improving pain and function is revision total knee arthroplasty. The procedure, risks, potential complications and rehab course are discussed in detail and the patient elects to proceed. Although she is not hurting in the knee itself, she is hurting around the area where the cyst is. 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 The only mechanism to solve this is to revise the knee. Replacing this with a new polyethylene does not address the root cause of this problem. We discussed this in detail. In order to fix this, she is going to need a total knee arthroplasty revision. I feel it is medically necessary to do this as the osteolysis is going to get worse and lead to a fracture in the future. They have been treated conservatively in the past for the above stated problem and despite conservative measures, they continue to have progressive pain and severe functional limitations and dysfunction. They have failed non-operative management. It is felt that they would benefit from undergoing revision total joint replacement. Risks and benefits of the procedure have been discussed with the patient and they elect to proceed with surgery. There are no active contraindications to surgery such as ongoing infection or rapidly progressive neurological disease.  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 Latex  Eggs  Yolk only  Family History Hypertension  father Drug / Alcohol Addiction  mother and father Cancer  mother, father, sister and brother  Social History Number of flights of stairs before winded  less than 1 Marital status  widowed Tobacco / smoke exposure  no Pain Contract  yes Illicit drug use  no Current work status  disabled Children  4 Alcohol use  current drinker; drinks wine; only occasionally per week Exercise  Exercises never Drug/Alcohol Rehab (Previously)  no Drug/Alcohol Rehab (Currently)  no Tobacco use  former smoker; smoke(d) 1/2 pack(s) per day Living situation  live alone Post-Surgical Plans  She wants to look into a SNF. RAPT Score of 5.  Medication History Enalapril Maleate (20MG  Tablet, Oral) Active. Metoprolol Tartrate (25MG  Tablet, Oral) Active. Hydrochlorothiazide (25MG  Tablet, Oral) Active. Aspirin EC (81MG  Tablet DR, Oral) Active. Cymbalta (60MG  Capsule DR Part, Oral) Active. Cyclobenzaprine HCl (10MG  Tablet, Oral) Active. (1 in the AM; 2 qhs) Percocet (10-325MG  Tablet, Oral) Active. Latanoprost (0.005% Solution, Ophthalmic) Active.   Past Surgical History  Back Surgery  SCS Spinal Fusion  neck Neck Disc Surgery  Total Knee Replacement  Date: 07/1998. right Tonsillectomy  Hysterectomy  complete (non-cancerous) Arthroscopy of Knee  right Appendectomy  Dilation and Curettage of Uterus - Multiple  Cesarean Delivery  2 times   Review of Systems General Not Present- Chills, Fatigue, Fever, Memory Loss, Night Sweats, Weight Gain and Weight Loss. Skin Not Present- Eczema, Hives, Itching, Lesions and Rash. HEENT Not Present- Dentures, Double Vision, Headache, Hearing Loss, Tinnitus and Visual Loss. Respiratory Not Present- Allergies, Chronic Cough,  Coughing up blood, Shortness of breath at rest and Shortness of breath with exertion. Cardiovascular Not Present- Chest Pain, Difficulty Breathing Lying Down, Murmur, Palpitations, Racing/skipping heartbeats and Swelling. Gastrointestinal Not Present- Abdominal Pain, Bloody Stool, Constipation, Diarrhea, Difficulty Swallowing, Heartburn, Jaundice, Loss of appetitie, Nausea and Vomiting. Female Genitourinary Not Present- Blood in Urine, Discharge, Flank Pain, Incontinence, Painful Urination, Urgency, Urinary frequency, Urinary Retention, Urinating at Night and Weak urinary stream. Musculoskeletal Present- Back Pain, Morning Stiffness and Muscle Weakness. Not Present- Joint Pain, Joint Swelling, Muscle Pain and Spasms. Neurological Not Present- Blackout spells, Difficulty with balance, Dizziness, Paralysis, Tremor and Weakness. Psychiatric Not Present- Insomnia.  Vitals  Weight: 260 lb Height: 67in Body Surface Area: 2.26 m Body Mass Index: 40.72 kg/m  Pulse: 72 (Regular)  BP: 188/64 (Sitting, Right Arm, Standard)   Physical Exam  General Mental Status -Alert, cooperative and good historian. General Appearance-pleasant, Not in acute distress. Orientation-Oriented X3. Build & Nutrition-Well nourished and Well developed.  Head and Neck Head-normocephalic, atraumatic . Neck Global Assessment - supple, no bruit auscultated on the right, no bruit auscultated on the left.  Eye Vision-Wears corrective lenses. Pupil - Bilateral-Regular and Round. Motion - Bilateral-EOMI.  ENMT Note: Upper denture plate   Chest and Lung Exam Auscultation Breath sounds - clear at anterior chest wall and clear at posterior chest wall. Adventitious sounds - No Adventitious  sounds.  Cardiovascular Auscultation Rhythm - Regular rate and rhythm. Heart Sounds - S1 WNL and S2 WNL. Murmurs & Other Heart Sounds - Auscultation of the heart reveals - No  Murmurs.  Abdomen Inspection Contour - Generalized moderate distention. Palpation/Percussion Tenderness - Abdomen is non-tender to palpation. Rigidity (guarding) - Abdomen is soft. Auscultation Auscultation of the abdomen reveals - Bowel sounds normal.  Female Genitourinary Note: Not done, not pertinent to present illness   Musculoskeletal Note: On exam, she is a well-developed female, alert and oriented, in no apparent distress. Her right hip has normal range of motion, no discomfort. Her right knee shows no effusion. Her range is about 0 to 115. There is no instability. She is very tender in the proximal tibia about 5 to 6 cm below the joint line. Some slight residual weakness with the left upper and lower extremity due to previous stroke.  RADIOGRAPHS AP both knees, lateral of the right show a cementless prosthesis. She has got a tibial base plate with screws in it. Unfortunately, at the bottom of the screws is a large cystic lesion, which is somewhat thinning the posterior cortex of the tibia.  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 Ola Spurr - Patient has been seen preoperatively and felt to be stable for surgery.  Topical TXA - CVA  Anesthesia Issues: None  Signed electronically by Ok Edwards, III PA-C

## 2016-01-21 NOTE — Op Note (Signed)
NAME:  Annette Mccullough, Annette Mccullough NO.:  [id number]  MEDICAL RECORD NO.:  [id number]  LOCATION:  [facility]                         FACILITY:  [facility]  PHYSICIAN:  Gaynelle Arabian, M.D.    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  PROCEDURE:  Right total knee arthroplasty revision.  SURGEON:  Gaynelle Arabian, M.D.  ASSISTANT:  Gerrit Halls, PA-C  ANESTHESIA:  General.  ESTIMATED BLOOD LOSS:  Minimal.  DRAINS:  Hemovac x1.  COMPLICATIONS:  None.  CONDITION:  Stable to recovery.  BRIEF CLINICAL NOTE:  [name] is a 70 year old female, had a right total knee arthroplasty done several years ago with a porous-coated technique. She was seen in my office for second opinion couple of months ago due to tibial pain.  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  We discussed treatment options and given the pain and possibility of fracture due to the lytic lesion, it was felt that this would be an indication to revise her knee.  She presents now for total knee arthroplasty revision.  PROCEDURE IN DETAIL:  After successful administration of general anesthetic, a tourniquet was placed high on her right thigh and her right lower extremity was prepped and draped in usual sterile fashion. Extremities wrapped in Esmarch and tourniquet inflated to 300 mmHg.  A midline incision was made with a 10 blade through subcutaneous tissue to the extensor mechanism.  A fresh blade was used to make a medial parapatellar arthrotomy.  There was no joint fluid noted.  Soft tissue over the proximal medial tibia subperiosteally elevated to the joint line with a knife and  into the semimembranosus bursa with a Cobb elevator.  Soft tissue laterally was elevated with attention being paid to avoiding the patellar tendon on tibial tubercle.  Patella was subluxed laterally, but did not evert.  It needed eversion in order to get adequate exposure.  Thus, I did a quadriceps snip in order to evert the patella.  This was performed about 3 cm above the superior pole of the patella.  The patella was everted and knee flexed to 90 degrees, and the tibia subluxed forward.  I removed the tibial polyethylene from the tibial tray.  There is wear on the undersurface of the polyethylene. This was a porous-coated tray with 4 screws in it.  The hexagonal screwdriver was used to remove these 4 screws which came out easily.  I then used osteotomes to disrupt the interface between the tibial component and bone, and tibial components easily removed.  It was a very large cystic lesion in the proximal tibia, but fortunately the cortex remained intact.  The fluid from that lesion was suctioned out and then cavity was removed and scraped to get the soft tissue out.  I then did the reaming for the tibial stem starting at 10, coursing up to 13 mm for a 13 mm  cemented stem.  We then sized for a 3 for the tibial tray.  The trial tray was placed and then the proximal reaming was performed, first a modular drill, then the modular drill with 13 x 30 stem extension.  I also prepared the tibia for a 29-mm sleeve.  Given the large flexion gap, I decided we would need to put 10 mm augments medial and lateral in order to get back to the appropriate joint line.  We then addressed the femur.  The interface between the femoral component and bone was disrupted using osteotomes.  The components were easily removed with no bone loss.  I then drilled into the femoral canal distally and thoroughly irrigated the canal.  We reamed up to 20 mm to get an excellent press-fit.  A 20 mm reamer was left in place  to serve as our intramedullary alignment guide.  The 5-degree right valgus alignment guide was placed for the distal femoral cut.  I removed 2 mm off the distal femur.  I did not need to place any augments.  Size 3 appears to be the most appropriate femoral size.  The AP and chamfer cutting block was then placed and we placed a spacer in the flexion gap in order to get a rectangular flexion gap, which also marked a rotation. This corresponded with the epicondylar axis.  The block was pinned and minimal bone was resected anterior, posterior, or chamfer.  The intercondylar block was placed and the intercondylar cut then made for the TC3 component.  The trial was then placed.  On the tibial side with a size 3 MBT revision tray with a 10 mm medial and lateral augments, 29 mm sleeve and 13 x 30 stem extension.  This had excellent fit on the tibial surface.  On the femoral side, we placed a size 3 TC3 femur with a 20 x 75 stem extension in the 5 degree right valgus position and the +2 position to effectively raise the stem and lower the trochlear flange down to the anterior cortex of the femur.  Trial was placed and then a 15 mm spacer placed.  Full extension was achieved with excellent varus, valgus, anterior-posterior balance throughout full range of motion.  We then inspected the patella.  Patelloplasty was performed.  I removed the soft tissue around the patella and then we noted that there was a large spur which had overgrown the patella.  I removed that spur and patella component fell out.  It was obviously loose and was being held on by that spur.  The thickness of the patella is 15 mm.  We cut down to 13 mm.  A 38 template was placed, new lug holes were drilled.  Trial patella was placed, and when we closing the quad snip it tracked normally.  We then assembled the components on the back table.  While the components were assembled, I let the tourniquet down for a total of 8 minutes.   Minor bleeding was stopped with electrocautery.  When the 8 minutes were up and the components were assembled, then we rewrapped the leg in Esmarch and reinflated to 300 mmHg.  The trials were removed, then I trialed for the cement restrictor, which was size 5 and size 5 cement restrictor was placed in the appropriate depth in the tibial canal.  Cut bone surfaces were then thoroughly irrigated with pulsatile lavage.  Cement was mixed, which was 3 batches of gentamicin impregnated cement.  Once ready for implantation, it was  injected into the tibial canal to feel that fit and to fill the canal.  The tibial components were cemented in place.  Size was as stated above.  We then cemented the femoral component distally with the press-fit stem.  A 15 mm trial inserts were placed, knee held in full extension, and all extruded cement removed.  A 38 patella cemented in place and held with patella clamp.  Once cement fully hardened, then the permanent 15 mm TC3 rotating platform insert was placed into the tibial tray.  The periosteum of the femur, the retinacular tissues, extensor mechanism, MCL and subcu tissues then injected with a total of 20 mL of Exparel, mixed with 40 mL of saline and then injected into the same tissues with an additional 20 mL of 0.25% Marcaine.  The quad snip was closed with interrupted #1 PDS suture.  The Hemovac drain was placed, the knee arthrotomy closed over the drain with a running #1 V-Loc suture. Flexion against gravity had been 120 degrees.  A 2 g of tranexamic acid mixed in 50 mL of saline then injected into the joint.  The tourniquet was then released for a second tourniquet time of 25 minutes.  The initial tourniquet time was 35 minutes.  Then, we let the tourniquet down for 8 and it was up for an additional 25.  Subcu bleeding was stopped with electrocautery.  A second limb of the drain was placed into the subcu tissues.  Subcu was closed with interrupted 2-0  Vicryl, and skin closed with staples.  Incisions cleaned and dried and a bulky sterile dressing applied.  The drains hooked to suction and then she was subsequently placed into a knee immobilizer, awakened and transported to Recovery in stable condition.  Please note that a surgical assistant is a medical necessity for this procedure in order to protect vital neurovascular structures, properly position the limb for safe removal of the old implant and for accurate and safe placement of the new implants.     Gaynelle Arabian, M.D.     FA/MEDQ  D:  01/21/2016  T:  01/21/2016  Job:  [id number]

## 2016-01-21 NOTE — Anesthesia Procedure Notes (Signed)
Procedure Name: Intubation Date/Time: 01/21/2016 3:28 PM Performed by: Cynda Familia Pre-anesthesia Checklist: Patient identified, Emergency Drugs available, Suction available and Patient being monitored Patient Re-evaluated:Patient Re-evaluated prior to inductionOxygen Delivery Method: Circle System Utilized Preoxygenation: Pre-oxygenation with 100% oxygen Intubation Type: IV induction Ventilation: Mask ventilation without difficulty Laryngoscope Size: Miller and 2 Grade View: Grade I Tube type: Oral Tube size: 7.0 mm Number of attempts: 1 Airway Equipment and Method: Stylet and Oral airway Placement Confirmation: ETT inserted through vocal cords under direct vision,  positive ETCO2 and breath sounds checked- equal and bilateral Secured at: 21 cm Tube secured with: Tape Dental Injury: Teeth and Oropharynx as per pre-operative assessment  Comments: Iv induction---Annette Mccullough- Roc administration- intubation AM CRNA- atraumatic- teeth and mouth as preop- no upper teeth

## 2016-01-21 NOTE — Interval H&P Note (Signed)
History and Physical Interval Note:  01/21/2016 3:05 PM  Annette Mccullough  has presented today for surgery, with the diagnosis of failed right total knee arthroplasty  The various methods of treatment have been discussed with the patient and family. After consideration of risks, benefits and other options for treatment, the patient has consented to  Procedure(s): RIGHT TOTAL KNEE REVISION (Right) as a surgical intervention .  The patient's history has been reviewed, patient examined, no change in status, stable for surgery.  I have reviewed the patient's chart and labs.  Questions were answered to the patient's satisfaction.     Gearlean Alf

## 2016-01-22 ENCOUNTER — Encounter (HOSPITAL_COMMUNITY): Payer: Self-pay | Admitting: Orthopedic Surgery

## 2016-01-22 LAB — BASIC METABOLIC PANEL
Anion gap: 7 (ref 5–15)
BUN: 17 mg/dL (ref 6–20)
CO2: 29 mmol/L (ref 22–32)
Calcium: 8.9 mg/dL (ref 8.9–10.3)
Chloride: 104 mmol/L (ref 101–111)
Creatinine, Ser: 0.62 mg/dL (ref 0.44–1.00)
GFR calc Af Amer: >60 mL/min (ref >60)
GFR calc non Af Amer: >60 mL/min (ref >60)
Glucose, Bld: 145 mg/dL — ABNORMAL HIGH (ref 65–99)
Potassium: 4.5 mmol/L (ref 3.5–5.1)
Sodium: 140 mmol/L (ref 135–145)

## 2016-01-22 LAB — CBC
HCT: 34.5 % — ABNORMAL LOW (ref 36.0–46.0)
Hemoglobin: 11.6 g/dL — ABNORMAL LOW (ref 12.0–15.0)
MCH: 31.2 pg (ref 26.0–34.0)
MCHC: 33.6 g/dL (ref 30.0–36.0)
MCV: 92.7 fL (ref 78.0–100.0)
Platelets: 260 10*3/uL (ref 150–400)
RBC: 3.72 MIL/uL — ABNORMAL LOW (ref 3.87–5.11)
RDW: 13.4 % (ref 11.5–15.5)
WBC: 14.3 10*3/uL — ABNORMAL HIGH (ref 4.0–10.5)

## 2016-01-22 MED ORDER — OXYCODONE HCL 5 MG PO TABS: 5.0000 mg | ORAL_TABLET | ORAL | Status: DC | PRN

## 2016-01-22 MED ORDER — METHOCARBAMOL 500 MG PO TABS: 500.0000 mg | ORAL_TABLET | Freq: Four times a day (QID) | ORAL | Status: DC | PRN

## 2016-01-22 MED ORDER — RIVAROXABAN 10 MG PO TABS: 10.0000 mg | ORAL_TABLET | Freq: Every day | ORAL | Status: DC

## 2016-01-22 NOTE — Progress Notes (Signed)
OT Cancellation Note  Patient Details Name: Annette Mccullough MRN: [id number] DOB: Feb 10, 1946   Cancelled Treatment:    Reason Eval/Treat Not Completed: Other (comment). Patient plans to d/c to SNF. Will defer all OT needs to OT at SNF.  Citlalli Weikel A 01/22/2016, 10:53 AM

## 2016-01-22 NOTE — Anesthesia Postprocedure Evaluation (Signed)
Anesthesia Post Note  Patient: Annette Mccullough  Procedure(s) Performed: Procedure(s) (LRB): RIGHT TOTAL KNEE REVISION (Right)  Patient location during evaluation: PACU Anesthesia Type: General Level of consciousness: awake and alert Pain management: pain level controlled Vital Signs Assessment: post-procedure vital signs reviewed and stable Respiratory status: spontaneous breathing, nonlabored ventilation, respiratory function stable and patient connected to nasal cannula oxygen Cardiovascular status: blood pressure returned to baseline and stable Postop Assessment: no signs of nausea or vomiting Anesthetic complications: no    Last Vitals:  Filed Vitals:   01/22/16 0230 01/22/16 0616  BP: 141/81 154/74  Pulse: 90 86  Temp: 36.6 C 36.5 C  Resp: 12 12    Last Pain:  Filed Vitals:   01/22/16 0803  PainSc: 9                  [name]

## 2016-01-22 NOTE — Discharge Instructions (Signed)
° °Dr. Frank Aluisio °Total Joint Specialist °[facility] °[address] °, [place] [address] °[phone] ° °TOTAL KNEE REPLACEMENT POSTOPERATIVE DIRECTIONS ° °Knee Rehabilitation, Guidelines Following Surgery  °Results after knee surgery are often greatly improved when you follow the exercise, range of motion and muscle strengthening exercises prescribed by your doctor. Safety measures are also important to protect the knee from further injury. Any time any of these exercises cause you to have increased pain or swelling in your knee joint, decrease the amount until you are comfortable again and slowly increase them. If you have problems or questions, call your caregiver or physical therapist for advice.  ° °HOME CARE INSTRUCTIONS  °Remove items at home which could result in a fall. This includes throw rugs or furniture in walking pathways.  °· ICE to the affected knee every three hours for 30 minutes at a time and then as needed for pain and swelling.  Continue to use ice on the knee for pain and swelling from surgery. You may notice swelling that will progress down to the foot and ankle.  This is normal after surgery.  Elevate the leg when you are not up walking on it.   °· Continue to use the breathing machine which will help keep your temperature down.  It is common for your temperature to cycle up and down following surgery, especially at night when you are not up moving around and exerting yourself.  The breathing machine keeps your lungs expanded and your temperature down. °· Do not place pillow under knee, focus on keeping the knee straight while resting ° °DIET °You may resume your previous home diet once your are discharged from the hospital. ° °DRESSING / WOUND CARE / SHOWERING °You may start showering once you are discharged home but do not submerge the incision under water. Just pat the incision dry and apply a dry gauze dressing on daily. °Change the surgical dressing  daily and reapply a dry dressing each time. ° °ACTIVITY °Walk with your walker as instructed. °Use walker as long as suggested by your caregivers. °Avoid periods of inactivity such as sitting longer than an hour when not asleep. This helps prevent blood clots.  °You may resume a sexual relationship in one month or when given the OK by your doctor.  °You may return to work once you are cleared by your doctor.  °Do not drive a car for 6 weeks or until released by you surgeon.  °Do not drive while taking narcotics. ° °WEIGHT BEARING °Weight bearing as tolerated with assist device (walker, cane, etc) as directed, use it as long as suggested by your surgeon or therapist, typically at least 4-6 weeks. ° °POSTOPERATIVE CONSTIPATION PROTOCOL °Constipation - defined medically as fewer than three stools per week and severe constipation as less than one stool per week. ° °One of the most common issues patients have following surgery is constipation.  Even if you have a regular bowel pattern at home, your normal regimen is likely to be disrupted due to multiple reasons following surgery.  Combination of anesthesia, postoperative narcotics, change in appetite and fluid intake all can affect your bowels.  In order to avoid complications following surgery, here are some recommendations in order to help you during your recovery period. ° °Colace (docusate) - Pick up an over-the-counter form of Colace or another stool softener and take twice a day as long as you are requiring postoperative pain medications.  Take with a full glass of water   daily.  If you experience loose stools or diarrhea, hold the colace until you stool forms back up.  If your symptoms do not get better within 1 week or if they get worse, check with your doctor. ° °Dulcolax (bisacodyl) - Pick up over-the-counter and take as directed by the product packaging as needed to assist with the movement of your bowels.  Take with a full glass of water.  Use this product as  needed if not relieved by Colace only.  ° °MiraLax (polyethylene glycol) - Pick up over-the-counter to have on hand.  MiraLax is a solution that will increase the amount of water in your bowels to assist with bowel movements.  Take as directed and can mix with a glass of water, juice, soda, coffee, or tea.  Take if you go more than two days without a movement. °Do not use MiraLax more than once per day. Call your doctor if you are still constipated or irregular after using this medication for 7 days in a row. ° °If you continue to have problems with postoperative constipation, please contact the office for further assistance and recommendations.  If you experience "the worst abdominal pain ever" or develop nausea or vomiting, please contact the office immediatly for further recommendations for treatment. ° °ITCHING ° If you experience itching with your medications, try taking only a single pain pill, or even half a pain pill at a time.  You can also use Benadryl over the counter for itching or also to help with sleep.  ° °TED HOSE STOCKINGS °Wear the elastic stockings on both legs for three weeks following surgery during the day but you may remove then at night for sleeping. ° °MEDICATIONS °See your medication summary on the “After Visit Summary” that the nursing staff will review with you prior to discharge.  You may have some home medications which will be placed on hold until you complete the course of blood thinner medication.  It is important for you to complete the blood thinner medication as prescribed by your surgeon.  Continue your approved medications as instructed at time of discharge. ° °PRECAUTIONS °If you experience chest pain or shortness of breath - call 911 immediately for transfer to the hospital emergency department.  °If you develop a fever greater that 101 F, purulent drainage from wound, increased redness or drainage from wound, foul odor from the wound/dressing, or calf pain - CONTACT YOUR  SURGEON.   °                                                °FOLLOW-UP APPOINTMENTS °Make sure you keep all of your appointments after your operation with your surgeon and caregivers. You should call the office at the above phone number and make an appointment for approximately two weeks after the date of your surgery or on the date instructed by your surgeon outlined in the "After Visit Summary". ° ° °RANGE OF MOTION AND STRENGTHENING EXERCISES  °Rehabilitation of the knee is important following a knee injury or an operation. After just a few days of immobilization, the muscles of the thigh which control the knee become weakened and shrink (atrophy). Knee exercises are designed to build up the tone and strength of the thigh muscles and to improve knee motion. Often times heat used for twenty to thirty minutes before working out will loosen   up your tissues and help with improving the range of motion but do not use heat for the first two weeks following surgery. These exercises can be done on a training (exercise) mat, on the floor, on a table or on a bed. Use what ever works the best and is most comfortable for you Knee exercises include:  Leg Lifts - While your knee is still immobilized in a splint or cast, you can do straight leg raises. Lift the leg to 60 degrees, hold for 3 sec, and slowly lower the leg. Repeat 10-20 times 2-3 times daily. Perform this exercise against resistance later as your knee gets better.  Quad and Hamstring Sets - Tighten up the muscle on the front of the thigh (Quad) and hold for 5-10 sec. Repeat this 10-20 times hourly. Hamstring sets are done by pushing the foot backward against an object and holding for 5-10 sec. Repeat as with quad sets.   Leg Slides: Lying on your back, slowly slide your foot toward your buttocks, bending your knee up off the floor (only go as far as is comfortable). Then slowly slide your foot back down until your leg is flat on the floor again.  Angel Wings:  Lying on your back spread your legs to the side as far apart as you can without causing discomfort.  A rehabilitation program following serious knee injuries can speed recovery and prevent re-injury in the future due to weakened muscles. Contact your doctor or a physical therapist for more information on knee rehabilitation.   IF YOU ARE TRANSFERRED TO A SKILLED REHAB FACILITY If the patient is transferred to a skilled rehab facility following release from the hospital, a list of the current medications will be sent to the facility for the patient to continue.  When discharged from the skilled rehab facility, please have the facility set up the patient's [place] prior to being released. Also, the skilled facility will be responsible for providing the patient with their medications at time of release from the facility to include their pain medication, the muscle relaxants, and their blood thinner medication. If the patient is still at the rehab facility at time of the two week follow up appointment, the skilled rehab facility will also need to assist the patient in arranging follow up appointment in our office and any transportation needs.  MAKE SURE YOU:  Understand these instructions.  Get help right away if you are not doing well or get worse.    Pick up stool softner and laxative for home use following surgery while on pain medications. Do not submerge incision under water. Please use good hand washing techniques while changing dressing each day. May shower starting three days after surgery. Please use a clean towel to pat the incision dry following showers. Continue to use ice for pain and swelling after surgery. Do not use any lotions or creams on the incision until instructed by your surgeon.  Information on my medicine - XARELTO (Rivaroxaban)  This medication education was reviewed with me or my healthcare representative as part of my discharge preparation.  The  pharmacist that spoke with me during my hospital stay was:  Rudean Haskell, [facility]  Why was Xarelto prescribed for you? Xarelto was prescribed for you to reduce the risk of blood clots forming after orthopedic surgery. The medical term for these abnormal blood clots is venous thromboembolism (VTE).  What do you need to know about xarelto ? Take your Xarelto ONCE DAILY at  the same time every day. You may take it either with or without food.  If you have difficulty swallowing the tablet whole, you may crush it and mix in applesauce just prior to taking your dose.  Take Xarelto exactly as prescribed by your doctor and DO NOT stop taking Xarelto without talking to the doctor who prescribed the medication.  Stopping without other VTE prevention medication to take the place of Xarelto may increase your risk of developing a clot.  After discharge, you should have regular check-up appointments with your healthcare provider that is prescribing your Xarelto.    What do you do if you miss a dose? If you miss a dose, take it as soon as you remember on the same day then continue your regularly scheduled once daily regimen the next day. Do not take two doses of Xarelto on the same day.   Important Safety Information A possible side effect of Xarelto is bleeding. You should call your healthcare provider right away if you experience any of the following: ? Bleeding from an injury or your nose that does not stop. ? Unusual colored urine (red or dark brown) or unusual colored stools (red or black). ? Unusual bruising for unknown reasons. ? A serious fall or if you hit your head (even if there is no bleeding).  Some medicines may interact with Xarelto and might increase your risk of bleeding while on Xarelto. To help avoid this, consult your healthcare provider or pharmacist prior to using any new prescription or non-prescription medications, including herbals, vitamins, non-steroidal  anti-inflammatory drugs (NSAIDs) and supplements.  This website has more information on Xarelto: [email or web address]

## 2016-01-22 NOTE — Evaluation (Signed)
Physical Therapy Evaluation Patient Details Name: Annette Mccullough MRN: [id number] DOB: [birth year] Today's Date: 01/22/2016   History of Present Illness  R TKR revision.  PT wtih Hx of CVAs with residual L side weakness, Cervical fusion and spinal cord stimulator placement  Clinical Impression  Pt s/p revision of R TKR presents with decreased R LE strength/ROM, post op pain, obesity and residual weakness of LE side 2* prior CVA limiting functional mobility.  Pt would greatly benefit from follow up rehab at SNF level to maximize IND and safety prior to return home with ltd assist.    Follow Up Recommendations SNF    Equipment Recommendations  None recommended by PT    Recommendations for Other Services OT consult     Precautions / Restrictions Precautions Precautions: Fall Required Braces or Orthoses: Knee Immobilizer - Right Knee Immobilizer - Right: Discontinue once straight leg raise with < 10 degree lag Restrictions Weight Bearing Restrictions: No Other Position/Activity Restrictions: WBAT      Mobility  Bed Mobility Overal bed mobility: Needs Assistance Bed Mobility: Supine to Sit     Supine to sit: Mod assist;+2 for physical assistance;+2 for safety/equipment     General bed mobility comments: cues for sequence and use of L LE to self assist  Transfers Overall transfer level: Needs assistance Equipment used: Rolling walker (2 wheeled) Transfers: Sit to/from Stand Sit to Stand: Mod assist;+2 physical assistance;From elevated surface Stand pivot transfers: Mod assist;+2 physical assistance       General transfer comment: cues for LE management and use of UEs to self assist;  Pt from EOB, to/from comode and to recliner  Ambulation/Gait Ambulation/Gait assistance: Mod assist;+2 physical assistance;+2 safety/equipment Ambulation Distance (Feet): 3 Feet Assistive device: Rolling walker (2 wheeled) Gait Pattern/deviations: Step-to pattern;Decreased step length -  right;Decreased step length - left;Shuffle;Trunk flexed Gait velocity: decr   General Gait Details: Increased time, cues for sequence, posture and position from RW.  Increased difficulty advancing L LE vs R   Stairs            Wheelchair Mobility    Modified Rankin (Stroke Patients Only)       Balance Overall balance assessment: Needs assistance Sitting-balance support: No upper extremity supported;Feet supported Sitting balance-Leahy Scale: Good     Standing balance support: Bilateral upper extremity supported Standing balance-Leahy Scale: Poor                               Pertinent Vitals/Pain Pain Assessment: 0-10 Pain Score: 7  Pain Location: R knee Pain Descriptors / Indicators: Aching;Sore Pain Intervention(s): Limited activity within patient's tolerance;Monitored during session;Premedicated before session;Ice applied;Patient requesting pain meds-RN notified    Home Living Family/patient expects to be discharged to:: Skilled nursing facility Living Arrangements: Alone                    Prior Function Level of Independence: Independent with assistive device(s)               Hand Dominance        Extremity/Trunk Assessment   Upper Extremity Assessment: Generalized weakness;LUE deficits/detail       LUE Deficits / Details: Residual weakness s/p CVA   Lower Extremity Assessment: Generalized weakness;RLE deficits/detail;LLE deficits/detail   LLE Deficits / Details: Residual weakness 2* previous CVA     Communication   Communication: No difficulties  Cognition Arousal/Alertness: Awake/alert Behavior During Therapy: WFL for tasks assessed/performed  Overall Cognitive Status: Within Functional Limits for tasks assessed                      General Comments      Exercises Total Joint Exercises Ankle Circles/Pumps: AROM;Both;15 reps;Supine      Assessment/Plan    PT Assessment Patient needs continued PT  services  PT Diagnosis Difficulty walking   PT Problem List Decreased strength;Decreased range of motion;Decreased activity tolerance;Decreased mobility;Decreased knowledge of use of DME;Pain;Obesity;Decreased balance  PT Treatment Interventions DME instruction;Gait training;Functional mobility training;Therapeutic activities;Therapeutic exercise;Patient/family education   PT Goals (Current goals can be found in the Care Plan section) Acute Rehab PT Goals Patient Stated Goal: Regain IND PT Goal Formulation: With patient Time For Goal Achievement: 01/24/16 Potential to Achieve Goals: Fair    Frequency 7X/week   Barriers to discharge        Co-evaluation               End of Session Equipment Utilized During Treatment: Gait belt;Right knee immobilizer Activity Tolerance: Patient limited by fatigue;Patient limited by pain Patient left: in chair;with call bell/phone within reach;with chair alarm set Nurse Communication: Mobility status         Time: [id number] PT Time Calculation (min) (ACUTE ONLY): 40 min   Charges:   PT Evaluation $PT Eval Low Complexity: 1 Procedure PT Treatments $Gait Training: 8-22 mins $Therapeutic Activity: 8-22 mins   PT G Codes:        Dow Blahnik Feb 10, 2016, 3:51 PM

## 2016-01-22 NOTE — Progress Notes (Signed)
Physical Therapy Treatment Patient Details Name: Annette Mccullough MRN: [id number] DOB: 01-14-1946 Today's Date: 01/22/2016    History of Present Illness R TKR revision.  PT with Hx of CVAs with residual L side weakness, Cervical fusion and spinal cord stimulator placement    PT Comments    Pt continues cooperative but progressing slowly.  Pt ltd by obesity and CVA related weakness non-op side.  Follow Up Recommendations  SNF     Equipment Recommendations  None recommended by PT    Recommendations for Other Services OT consult     Precautions / Restrictions Precautions Precautions: Fall Required Braces or Orthoses: Knee Immobilizer - Right Knee Immobilizer - Right: Discontinue once straight leg raise with < 10 degree lag Restrictions Weight Bearing Restrictions: No Other Position/Activity Restrictions: WBAT    Mobility  Bed Mobility Overal bed mobility: Needs Assistance Bed Mobility: Sit to Supine       Sit to supine: Mod assist;+2 for safety/equipment   General bed mobility comments: cues for sequence and use of L LE to self assist  Transfers Overall transfer level: Needs assistance Equipment used: Rolling walker (2 wheeled) Transfers: Sit to/from Stand Sit to Stand: Mod assist;+2 physical assistance;From elevated surface Stand pivot transfers: Mod assist;+2 physical assistance       General transfer comment: cues for LE management and use of UEs to self assist;  Pt from recliner,  to/from comode and to EOB  Ambulation/Gait Ambulation/Gait assistance: Mod assist Ambulation Distance (Feet): 2 Feet Assistive device: Rolling walker (2 wheeled) Gait Pattern/deviations: Step-to pattern;Decreased step length - right;Decreased step length - left;Shuffle;Trunk flexed Gait velocity: decr   General Gait Details: Increased time, cues for sequence, posture and position from RW.  Increased difficulty advancing L LE vs R    Stairs            Wheelchair  Mobility    Modified Rankin (Stroke Patients Only)       Balance                                    Cognition Arousal/Alertness: Awake/alert Behavior During Therapy: WFL for tasks assessed/performed Overall Cognitive Status: Within Functional Limits for tasks assessed                      Exercises      General Comments        Pertinent Vitals/Pain Pain Assessment: 0-10 Pain Score: 5  Pain Location: R KNEE Pain Descriptors / Indicators: Aching;Sore Pain Intervention(s): Limited activity within patient's tolerance;Monitored during session;Premedicated before session    Home Living Family/patient expects to be discharged to:: Skilled nursing facility                    Prior Function            PT Goals (current goals can now be found in the care plan section) Acute Rehab PT Goals Patient Stated Goal: Regain IND PT Goal Formulation: With patient Time For Goal Achievement: 01/24/16 Potential to Achieve Goals: Fair Progress towards PT goals: Progressing toward goals    Frequency  7X/week    PT Plan Current plan remains appropriate    Co-evaluation             End of Session Equipment Utilized During Treatment: Gait belt;Right knee immobilizer Activity Tolerance: Patient limited by fatigue Patient left: in bed;with call bell/phone within reach  Time: [id number] PT Time Calculation (min) (ACUTE ONLY): 13 min  Charges:  $Gait Training: 8-22 mins $Therapeutic Activity: 8-22 mins                    G Codes:      Danyiel Crespin 2016/02/06, 4:04 PM

## 2016-01-22 NOTE — NC FL2 (Signed)
[place] LEVEL OF CARE SCREENING TOOL     IDENTIFICATION  Patient Name: Annette Mccullough Birthdate: Oct 23, 1945 Sex: female Admission Date (Current Location): 01/21/2016  [facility] and [place] Number:  Herbalist and Address:  [facility],  [place] [address], [place]      Provider Number: [id number]  Attending Physician Name and Address:  Gaynelle Arabian, MD  Relative Name and Phone Number:       Current Level of Care: Hospital Recommended Level of Care: [place] Prior Approval Number:    Date Approved/Denied:   PASRR Number:   [id number]  Discharge Plan: SNF    Current Diagnoses: Patient Active Problem List   Diagnosis Date Noted  . Failed total right knee replacement ([place]) 01/21/2016  . OA (osteoarthritis) of knee 01/21/2016  . Chronic pain 08/25/2011  . Morbid obesity, BMI unknown ([place]) 08/25/2011  . Hypertension 08/25/2011    Orientation RESPIRATION BLADDER Height & Weight     Self, Time, Situation, Place  Normal Continent Weight: 267 lb (121.11 kg) Height:  5\' 7"  (170.2 cm)  BEHAVIORAL SYMPTOMS/MOOD NEUROLOGICAL BOWEL NUTRITION STATUS  Other (Comment) (none)  alert and oriented Continent Diet (regular diet)  AMBULATORY STATUS COMMUNICATION OF NEEDS Skin   Extensive Assist Verbally Surgical wounds                       Personal Care Assistance Level of Assistance  Bathing, Feeding, Dressing Bathing Assistance: Maximum assistance Feeding assistance: Limited assistance Dressing Assistance: Limited assistance     Functional Limitations Info  Sight, Hearing, Speech Sight Info: Adequate Hearing Info: Adequate Speech Info: Adequate    SPECIAL CARE FACTORS FREQUENCY  PT (By licensed PT), OT (By licensed OT)     PT Frequency: 5 OT Frequency: 5            Contractures Contractures Info: Not present    Additional Factors Info  Code Status, Allergies, Psychotropic, Insulin Sliding  Scale, Isolation Precautions Code Status Info: Full Code Allergies Info: Eggs Or Egg-derived Products, Latex, Tape Psychotropic Info: cymbalta Insulin Sliding Scale Info: none Isolation Precautions Info: none     Current Medications (01/22/2016):  This is the current hospital active medication list Current Facility-Administered Medications  Medication Dose Route Frequency Provider Last Rate Last Dose  . 0.9 %  sodium chloride infusion   Intravenous Continuous Gaynelle Arabian, MD 50 mL/hr at 01/22/16 1111    . acetaminophen (TYLENOL) tablet 650 mg  650 mg Oral Q6H PRN Gaynelle Arabian, MD       Or  . acetaminophen (TYLENOL) suppository 650 mg  650 mg Rectal Q6H PRN Gaynelle Arabian, MD      . acetaminophen (TYLENOL) tablet 1,000 mg  1,000 mg Oral Q6H Gaynelle Arabian, MD   1,000 mg at 01/22/16 1113  . bisacodyl (DULCOLAX) suppository 10 mg  10 mg Rectal Daily PRN Gaynelle Arabian, MD      . diphenhydrAMINE (BENADRYL) 12.5 MG/5ML elixir 12.5-25 mg  12.5-25 mg Oral Q4H PRN Gaynelle Arabian, MD      . docusate sodium (COLACE) capsule 100 mg  100 mg Oral TID Gaynelle Arabian, MD   100 mg at 01/22/16 1113  . DULoxetine (CYMBALTA) DR capsule 60 mg  60 mg Oral Daily Gaynelle Arabian, MD   60 mg at 01/22/16 1113  . hydrochlorothiazide (HYDRODIURIL) tablet 25 mg  25 mg Oral q morning - 10a Gaynelle Arabian, MD   25 mg at 01/22/16  1113  . latanoprost (XALATAN) 0.005 % ophthalmic solution 1 drop  1 drop Both Eyes QHS Gaynelle Arabian, MD   1 drop at 01/21/16 2121  . loratadine (CLARITIN) tablet 10 mg  10 mg Oral Daily PRN Gaynelle Arabian, MD      . menthol-cetylpyridinium (CEPACOL) lozenge 3 mg  1 lozenge Oral PRN Gaynelle Arabian, MD       Or  . phenol (CHLORASEPTIC) mouth spray 1 spray  1 spray Mouth/Throat PRN Gaynelle Arabian, MD      . methocarbamol (ROBAXIN) tablet 500 mg  500 mg Oral Q6H PRN Gaynelle Arabian, MD   500 mg at 01/22/16 1419   Or  . methocarbamol (ROBAXIN) 500 mg in dextrose 5 % 50 mL IVPB  500 mg Intravenous Q6H PRN  Gaynelle Arabian, MD   500 mg at 01/22/16 0053  . metoCLOPramide (REGLAN) tablet 5-10 mg  5-10 mg Oral Q8H PRN Gaynelle Arabian, MD       Or  . metoCLOPramide (REGLAN) injection 5-10 mg  5-10 mg Intravenous Q8H PRN Gaynelle Arabian, MD      . metoprolol succinate (TOPROL-XL) 24 hr tablet 25 mg  25 mg Oral BID Gaynelle Arabian, MD   25 mg at 01/22/16 1113  . morphine 2 MG/ML injection 1 mg  1 mg Intravenous Q2H PRN Gaynelle Arabian, MD   1 mg at 01/22/16 0414  . ondansetron (ZOFRAN) tablet 4 mg  4 mg Oral Q6H PRN Gaynelle Arabian, MD       Or  . ondansetron [facility]) injection 4 mg  4 mg Intravenous Q6H PRN Gaynelle Arabian, MD      . oxyCODONE (Oxy IR/ROXICODONE) immediate release tablet 5-10 mg  5-10 mg Oral Q3H PRN Gaynelle Arabian, MD   10 mg at 01/22/16 1419  . polyethylene glycol (MIRALAX / GLYCOLAX) packet 17 g  17 g Oral Daily PRN Gaynelle Arabian, MD      . rivaroxaban [facility]) tablet 10 mg  10 mg Oral Q breakfast Gaynelle Arabian, MD   10 mg at 01/22/16 0803  . sodium phosphate (FLEET) 7-19 GM/118ML enema 1 enema  1 enema Rectal Once PRN Gaynelle Arabian, MD         Discharge Medications: Please see discharge summary for a list of discharge medications.  Relevant Imaging Results:  Relevant Lab Results:   Additional Information SS # [id number]  Lilly Cove, LCSW

## 2016-01-22 NOTE — Clinical Social Work Note (Signed)
Clinical Social Work Assessment  Patient Details  Name: Annette Mccullough MRN: [id number] Date of Birth: December 13, 1945  Date of referral:  01/22/16               Reason for consult:  Facility Placement                Permission sought to share information with:  Case Manager, Customer service manager Permission granted to share information::  Yes, Verbal Permission Granted  Name::        Agency::  SNF referral for placement: [place]  Relationship::     Contact Information:     Housing/Transportation Living arrangements for the past 2 months:  [place] of Information:  Patient, Medical Team Patient Interpreter Needed:  None Criminal Activity/Legal Involvement Pertinent to Current Situation/Hospitalization:  No - Comment as needed Significant Relationships:  Adult Children, Other Family Members Lives with:  Self Do you feel safe going back to the place where you live?  No Need for family participation in patient care:  Yes (Comment) (has 4 adult children if needed they can be involved.)  Care giving concerns:  Patient reports she lives alone but has 4 children that are involved in care. Report she discussed disposition prior to surgery with MD and agreed upon SNF. She would like [facility] if at all possible and gave permission to send clinicals for review.   Social Worker assessment / plan:  LCSW completed SNF referral work up and faxed clinicals through the [facility] on epic to [place]. Patient has no preference and will be given bed offers once available, most likely 4/21. Patient reports she thinks she is being discharged on 4/21 and is agreeable to plan. LCSW to follow up with plan and continue to assist with disposition.  Employment status:  Retired Forensic scientist:  [employer] PT Recommendations:  [place] / Referral to community resources:  [place]  Patient/Family's Response to care:   Agreeable to plan  Patient/Family's Understanding of and Emotional Response to Diagnosis, Current Treatment, and Prognosis:  Patient well aware of her pain level, current treatment plan, and voices hopefulness of improving independence at home once she completes rehab due to having surgery.    Emotional Assessment Appearance:  Appears stated age Attitude/Demeanor/Rapport:  Other (very pleasant and cooperative) Affect (typically observed):  Accepting, Adaptable, Pleasant Orientation:  Oriented to Self, Oriented to Place, Oriented to  Time, Oriented to Situation Alcohol / Substance use:  Not Applicable Psych involvement (Current and /or in the community):  No (Comment)  Discharge Needs  Concerns to be addressed:  No discharge needs identified Readmission within the last 30 days:  No Current discharge risk:  None Barriers to Discharge:  No Barriers Identified, Continued Medical Work up   Lilly Cove, LCSW 01/22/2016, 3:38 PM

## 2016-01-22 NOTE — Progress Notes (Signed)
Physical Therapy Treatment Patient Details Name: Annette Mccullough MRN: [id number] DOB: 15-Mar-1946 Today's Date: 01/22/2016    History of Present Illness R TKR revision.  PT wtih Hx of CVAs with residual L side weakness, Cervical fusion and spinal cord stimulator placement    PT Comments    Pt progressing slowly, ltd by obesity and CVA related weakness on non-op side.  Follow Up Recommendations  SNF     Equipment Recommendations  None recommended by PT    Recommendations for Other Services OT consult     Precautions / Restrictions Precautions Precautions: Fall Required Braces or Orthoses: Knee Immobilizer - Right Knee Immobilizer - Right: Discontinue once straight leg raise with < 10 degree lag Restrictions Weight Bearing Restrictions: No Other Position/Activity Restrictions: WBAT    Mobility  Bed Mobility Overal bed mobility: Needs Assistance Bed Mobility: Sit to Supine     Supine to sit: Mod assist;+2 for physical assistance;+2 for safety/equipment Sit to supine: Mod assist;+2 for safety/equipment   General bed mobility comments: cues for sequence and use of L LE to self assist  Transfers Overall transfer level: Needs assistance Equipment used: Rolling walker (2 wheeled) Transfers: Sit to/from Stand Sit to Stand: Mod assist;+2 physical assistance;From elevated surface Stand pivot transfers: Mod assist;+2 physical assistance       General transfer comment: cues for LE management and use of UEs to self assist;  Pt from recliner,  to/from comode and to EOB  Ambulation/Gait Ambulation/Gait assistance: Mod assist;+2 physical assistance Ambulation Distance (Feet): 3 Feet Assistive device: Rolling walker (2 wheeled) Gait Pattern/deviations: Step-to pattern;Decreased step length - right;Decreased step length - left;Shuffle;Trunk flexed Gait velocity: decr   General Gait Details: Increased time, cues for sequence, posture and position from RW.  Increased  difficulty advancing L LE vs R    Stairs            Wheelchair Mobility    Modified Rankin (Stroke Patients Only)       Balance Overall balance assessment: Needs assistance Sitting-balance support: No upper extremity supported;Feet supported Sitting balance-Leahy Scale: Good     Standing balance support: Bilateral upper extremity supported Standing balance-Leahy Scale: Poor                      Cognition Arousal/Alertness: Awake/alert Behavior During Therapy: WFL for tasks assessed/performed Overall Cognitive Status: Within Functional Limits for tasks assessed                      Exercises Total Joint Exercises Ankle Circles/Pumps: AROM;Both;15 reps;Supine    General Comments        Pertinent Vitals/Pain Pain Assessment: 0-10 Pain Score: 5  Pain Location: R knee Pain Descriptors / Indicators: Aching;Sore Pain Intervention(s): Limited activity within patient's tolerance;Monitored during session;Premedicated before session;Ice applied    Home Living Family/patient expects to be discharged to:: Skilled nursing facility Living Arrangements: Alone                  Prior Function Level of Independence: Independent with assistive device(s)          PT Goals (current goals can now be found in the care plan section) Acute Rehab PT Goals Patient Stated Goal: Regain IND PT Goal Formulation: With patient Time For Goal Achievement: 01/24/16 Potential to Achieve Goals: Fair Progress towards PT goals: Progressing toward goals    Frequency  7X/week    PT Plan Current plan remains appropriate    Co-evaluation  End of Session Equipment Utilized During Treatment: Gait belt;Right knee immobilizer Activity Tolerance: Patient limited by fatigue Patient left: in bed;with call bell/phone within reach     Time: [id number] PT Time Calculation (min) (ACUTE ONLY): 34 min  Charges:  $Gait Training: 8-22 mins $Therapeutic  Activity: 8-22 mins                    G Codes:      Coree Brame January 23, 2016, 3:59 PM

## 2016-01-22 NOTE — Progress Notes (Signed)
   Subjective: 1 Day Post-Op Procedure(s) (LRB): RIGHT TOTAL KNEE REVISION (Right) Patient reports pain as moderate.   Patient seen in rounds with Dr. Wynelle Link. Patient is well, and has had no acute complaints or problems other than some discomfort in the right knee and in the low back. No issues overnight. No SOB or chest pain. Plan is to go Skilled nursing facility after hospital stay.  Objective: Vital signs in last 24 hours: Temp:  [97.5 F (36.4 C)-98.4 F (36.9 C)] 97.7 F (36.5 C) (04/20 0616) Pulse Rate:  [69-90] 86 (04/20 0616) Resp:  [9-17] 12 (04/20 0616) BP: (125-160)/(72-94) 154/74 mmHg (04/20 0616) SpO2:  [95 %-100 %] 99 % (04/20 0616) Weight:  [121.11 kg (267 lb)] 121.11 kg (267 lb) (04/19 1321)  Intake/Output from previous day:  Intake/Output Summary (Last 24 hours) at 01/22/16 0645 Last data filed at 01/22/16 0630  Gross per 24 hour  Intake   3250 ml  Output    815 ml  Net   2435 ml    Intake/Output this shift: Total I/O In: 1200 [I.V.:1000; IV Piggyback:200] Out: 640 [Urine:450; Drains:190]  Labs:  Recent Labs  01/22/16 0410  HGB 11.6*    Recent Labs  01/22/16 0410  WBC 14.3*  RBC 3.72*  HCT 34.5*  PLT 260    Recent Labs  01/22/16 0410  NA 140  K 4.5  CL 104  CO2 29  BUN 17  CREATININE 0.62  GLUCOSE 145*  CALCIUM 8.9    EXAM General - Patient is Alert and Oriented Extremity - Neurologically intact Intact pulses distally Dorsiflexion/Plantar flexion intact No cellulitis present Dressing - dressing C/D/I Motor Function - intact, moving foot and toes well on exam.  Hemovac pulled without difficulty.  Past Medical History  Diagnosis Date  . Heart murmur     rec's antibiotic prior to dental work, never had any cardiac testing done  . Hypertension   . Blood transfusion     74- post childbirth & /w joint replacement   . Chronic kidney disease     renal calculi /w pregnancy  . Neuromuscular disorder ([place])     DDD- lumbar &  cervical   . Arthritis     multiple areas  . Fibromyalgia   . Seasonal allergies   . Psoriasis   . Complication of anesthesia     post anesthesia - swelling of lip- relative to tape  . Stroke [facility])     1972 & 2005, 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- some weakness remains   . Family history of adverse reaction to anesthesia     sister- slow to wake up and " has died once "     Assessment/Plan: 1 Day Post-Op Procedure(s) (LRB): RIGHT TOTAL KNEE REVISION (Right) Principal Problem:   Failed total right knee replacement ([place]) Active Problems:   OA (osteoarthritis) of knee  Estimated body mass index is 41.81 kg/(m^2) as calculated from the following:   Height as of this encounter: 5\' 7"  (1.702 m).   Weight as of this encounter: 121.11 kg (267 lb). Advance diet Up with therapy D/C IV fluids when tolerating POs well  DVT Prophylaxis - Xarelto Weight-Bearing as tolerated  D/C O2 and Pulse OX and try on Room Air  Up with therapy today. Plan for DC to SNF tomorrow.   Ardeen Jourdain, PA-C Orthopaedic Surgery 01/22/2016, 6:45 AM

## 2016-01-22 NOTE — Clinical Social Work Placement (Signed)
   CLINICAL SOCIAL WORK PLACEMENT  NOTE  Date:  01/22/2016  Patient Details  Name: Annette Mccullough MRN: [id number] Date of Birth: 07-13-1946  Clinical Social Work is seeking post-discharge placement for this patient at the [place] level of care (*CSW will initial, date and re-position this form in  chart as items are completed):  Yes   Patient/family provided with [place] Work Department's list of facilities offering this level of care within the geographic area requested by the patient (or if unable, by the patient's family).  Yes   Patient/family informed of their freedom to choose among providers that offer the needed level of care, that participate in Medicare, Medicaid or managed care program needed by the patient, have an available bed and are willing to accept the patient.  Yes   Patient/family informed of [place]'s ownership interest in [facility] and [facility], as well as of the fact that they are under no obligation to receive care at these facilities.  PASRR submitted to EDS on 01/22/16     PASRR number received on 01/22/16     Existing PASRR number confirmed on       FL2 transmitted to all facilities in geographic area requested by pt/family on 01/22/16     FL2 transmitted to all facilities within larger geographic area on       Patient informed that his/her managed care company has contracts with or will negotiate with certain facilities, including the following:            Patient/family informed of bed offers received.  Patient chooses bed at       Physician recommends and patient chooses bed at      Patient to be transferred to   on  .  Patient to be transferred to facility by       Patient family notified on   of transfer.  Name of family member notified:        PHYSICIAN Please sign FL2     Additional Comment:    _______________________________________________ Lilly [place], LCSW 01/22/2016,  3:22 PM

## 2016-01-23 ENCOUNTER — Encounter (HOSPITAL_COMMUNITY): Payer: Self-pay | Admitting: Orthopedic Surgery

## 2016-01-23 LAB — CBC
HCT: 30.6 % — ABNORMAL LOW (ref 36.0–46.0)
Hemoglobin: 10.5 g/dL — ABNORMAL LOW (ref 12.0–15.0)
MCH: 32 pg (ref 26.0–34.0)
MCHC: 34.3 g/dL (ref 30.0–36.0)
MCV: 93.3 fL (ref 78.0–100.0)
Platelets: 281 10*3/uL (ref 150–400)
RBC: 3.28 MIL/uL — ABNORMAL LOW (ref 3.87–5.11)
RDW: 13.7 % (ref 11.5–15.5)
WBC: 12.6 10*3/uL — ABNORMAL HIGH (ref 4.0–10.5)

## 2016-01-23 LAB — BASIC METABOLIC PANEL WITH GFR
Anion gap: 11 (ref 5–15)
BUN: 15 mg/dL (ref 6–20)
CO2: 27 mmol/L (ref 22–32)
Calcium: 9.2 mg/dL (ref 8.9–10.3)
Chloride: 101 mmol/L (ref 101–111)
Creatinine, Ser: 0.74 mg/dL (ref 0.44–1.00)
GFR calc Af Amer: >60 mL/min
GFR calc non Af Amer: >60 mL/min
Glucose, Bld: 139 mg/dL — ABNORMAL HIGH (ref 65–99)
Potassium: 3.9 mmol/L (ref 3.5–5.1)
Sodium: 139 mmol/L (ref 135–145)

## 2016-01-23 NOTE — Progress Notes (Signed)
   Subjective: 2 Days Post-Op Procedure(s) (LRB): RIGHT TOTAL KNEE REVISION (Right) Patient reports pain as moderate.   Patient seen in rounds with Dr. Wynelle Link. Patient is well, and has had no acute complaints or problems other than discomfort in her right knee and frustration with the difficulty with therapy. No issues overnight. No SOB or chest pain.    Objective: Vital signs in last 24 hours: Temp:  [97.4 F (36.3 C)-99.2 F (37.3 C)] 97.4 F (36.3 C) (04/21 0519) Pulse Rate:  [84-92] 84 (04/21 0519) Resp:  [12-14] 14 (04/21 0519) BP: (130-133)/(52-64) 133/52 mmHg (04/21 0519) SpO2:  [96 %-100 %] 96 % (04/21 0519)  Intake/Output from previous day:  Intake/Output Summary (Last 24 hours) at 01/23/16 0742 Last data filed at 01/23/16 0600  Gross per 24 hour  Intake 2124.58 ml  Output   2800 ml  Net -675.42 ml     Labs:  Recent Labs  01/22/16 0410 01/23/16 0410  HGB 11.6* 10.5*    Recent Labs  01/22/16 0410 01/23/16 0410  WBC 14.3* 12.6*  RBC 3.72* 3.28*  HCT 34.5* 30.6*  PLT 260 281    Recent Labs  01/22/16 0410 01/23/16 0410  NA 140 139  K 4.5 3.9  CL 104 101  CO2 29 27  BUN 17 15  CREATININE 0.62 0.74  GLUCOSE 145* 139*  CALCIUM 8.9 9.2    EXAM General - Patient is Alert and Oriented Extremity - Neurologically intact Intact pulses distally Dorsiflexion/Plantar flexion intact No cellulitis present Compartment soft Dressing/Incision - slight blood tinged drainage. Dressing changed. Motor Function - intact, moving foot and toes well on exam.   Past Medical History  Diagnosis Date  . Heart murmur     rec's antibiotic prior to dental work, never had any cardiac testing done  . Hypertension   . Blood transfusion     74- post childbirth & /w joint replacement   . Chronic kidney disease     renal calculi /w pregnancy  . Neuromuscular disorder ([place])     DDD- lumbar & cervical   . Arthritis     multiple areas  . Fibromyalgia   . Seasonal  allergies   . Psoriasis   . Complication of anesthesia     post anesthesia - swelling of lip- relative to tape  . 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 Family history of adverse reaction to anesthesia     sister- slow to wake up and " has died once "     Assessment/Plan: 2 Days Post-Op Procedure(s) (LRB): RIGHT TOTAL KNEE REVISION (Right) Principal Problem:   Failed total right knee replacement ([place]) Active Problems:   OA (osteoarthritis) of knee  Estimated body mass index is 41.81 kg/(m^2) as calculated from the following:   Height as of this encounter: 5\' 7"  (1.702 m).   Weight as of this encounter: 121.11 kg (267 lb). Advance diet Up with therapy D/C IV fluids Discharge to SNF  DVT Prophylaxis - Xarelto Weight-Bearing as tolerated   Continue PT today. Plan for DC to SNF today.  Ardeen Jourdain, PA-C Orthopaedic Surgery 01/23/2016, 7:42 AM

## 2016-01-23 NOTE — Clinical Social Work Placement (Signed)
   CLINICAL SOCIAL WORK PLACEMENT  NOTE  Date:  01/23/2016  Patient Details  Name: DARI MARTORELLA MRN: [id number] Date of Birth: 01/13/1946  Clinical Social Work is seeking post-discharge placement for this patient at the [place] level of care (*CSW will initial, date and re-position this form in  chart as items are completed):  Yes   Patient/family provided with [place] Work Department's list of facilities offering this level of care within the geographic area requested by the patient (or if unable, by the patient's family).  Yes   Patient/family informed of their freedom to choose among providers that offer the needed level of care, that participate in Medicare, Medicaid or managed care program needed by the patient, have an available bed and are willing to accept the patient.  Yes   Patient/family informed of [place]'s ownership interest in [facility] and [facility], as well as of the fact that they are under no obligation to receive care at these facilities.  PASRR submitted to EDS on 01/22/16     PASRR number received on 01/22/16     Existing PASRR number confirmed on       FL2 transmitted to all facilities in geographic area requested by pt/family on 01/22/16     FL2 transmitted to all facilities within larger geographic area on       Patient informed that his/her managed care company has contracts with or will negotiate with certain facilities, including the following:        Yes   Patient/family informed of bed offers received.  Patient [facility] bed at [facility]     Physician recommends and patient chooses bed at      Patient to be transferred to   on  .  Patient to be transferred to facility by       Patient family notified on   of transfer.  Name of family member notified:        PHYSICIAN Please sign FL2     Additional Comment:    _______________________________________________ Drema Balzarine D,  LCSW 01/23/2016, 10:15 AM

## 2016-01-23 NOTE — Progress Notes (Signed)
Physical Therapy Treatment Patient Details Name: Annette Mccullough MRN: [id number] DOB: July 17, 1946 Today's Date: 02-21-16    History of Present Illness R TKR revision.  PT with Hx of CVAs with residual L side weakness, Cervical fusion and spinal cord stimulator placement    PT Comments    Pt ltd this pm by fatigue but agreeable to therex in bed.  Follow Up Recommendations  SNF     Equipment Recommendations  None recommended by PT    Recommendations for Other Services OT consult     Precautions / Restrictions Precautions Precautions: Fall Required Braces or Orthoses: Knee Immobilizer - Right Knee Immobilizer - Right: Discontinue once straight leg raise with < 10 degree lag Restrictions Weight Bearing Restrictions: No LLE Weight Bearing: Weight bearing as tolerated    Mobility  Bed Mobility                  Transfers                    Ambulation/Gait                 Stairs            Wheelchair Mobility    Modified Rankin (Stroke Patients Only)       Balance                                    Cognition Arousal/Alertness: Awake/alert Behavior During Therapy: WFL for tasks assessed/performed Overall Cognitive Status: Within Functional Limits for tasks assessed                      Exercises Total Joint Exercises Ankle Circles/Pumps: AROM;Both;15 reps;Supine Quad Sets: AROM;Both;15 reps;Supine Heel Slides: AAROM;Right;15 reps;Supine Straight Leg Raises: AAROM;Right;15 reps;Supine Goniometric ROM: AAROM R knee -10- 50    General Comments        Pertinent Vitals/Pain Pain Assessment: 0-10 Pain Score: 4  Pain Location: R knee Pain Descriptors / Indicators: Aching;Sore Pain Intervention(s): Limited activity within patient's tolerance;Monitored during session;Premedicated before session;Ice applied    Home Living                      Prior Function            PT Goals (current  goals can now be found in the care plan section) Acute Rehab PT Goals Patient Stated Goal: Regain IND PT Goal Formulation: With patient Time For Goal Achievement: 01/24/16 Potential to Achieve Goals: Fair Progress towards PT goals: Progressing toward goals    Frequency  7X/week    PT Plan Current plan remains appropriate    Co-evaluation             End of Session Equipment Utilized During Treatment: Gait belt;Right knee immobilizer Activity Tolerance: Patient limited by fatigue Patient left: in bed;with call bell/phone within reach;with bed alarm set     Time: [id number] PT Time Calculation (min) (ACUTE ONLY): 23 min  Charges:  $Therapeutic Exercise: 23-37 mins                    G Codes:      Annette Mccullough 02-21-16, 5:16 PM

## 2016-01-23 NOTE — Progress Notes (Signed)
Physical Therapy Treatment Patient Details Name: Annette Mccullough MRN: [id number] DOB: 06-11-1946 Today's Date: 01/23/2016    History of Present Illness R TKR revision.  PT with Hx of CVAs with residual L side weakness, Cervical fusion and spinal cord stimulator placement    PT Comments      Follow Up Recommendations  SNF     Equipment Recommendations  Rolling walker with 5" wheels (Wide RW)    Recommendations for Other Services OT consult     Precautions / Restrictions Precautions Precautions: Fall Required Braces or Orthoses: Knee Immobilizer - Right Knee Immobilizer - Right: Discontinue once straight leg raise with < 10 degree lag Restrictions Weight Bearing Restrictions: No LLE Weight Bearing: Weight bearing as tolerated    Mobility  Bed Mobility Overal bed mobility: Needs Assistance Bed Mobility: Sit to Supine     Supine to sit: Mod assist Sit to supine: Mod assist   General bed mobility comments: cues for sequence and use of L LE to self assist  Transfers Overall transfer level: Needs assistance Equipment used: Rolling walker (2 wheeled) Transfers: Sit to/from Stand Sit to Stand: Mod assist;+2 physical assistance Stand pivot transfers: Mod assist;+2 physical assistance       General transfer comment: cues for LE management and use of UEs to self assist;  Pt from EOB,  to/from comode and to recliner  Ambulation/Gait Ambulation/Gait assistance: Mod assist;+2 safety/equipment Ambulation Distance (Feet): 3 Feet Assistive device: Rolling walker (2 wheeled) Gait Pattern/deviations: Step-to pattern;Decreased step length - right;Decreased step length - left;Shuffle;Trunk flexed Gait velocity: decr   General Gait Details: Increased time, cues for sequence, posture and position from RW.  Increased difficulty advancing L LE vs R    Stairs            Wheelchair Mobility    Modified Rankin (Stroke Patients Only)       Balance                                     Cognition Arousal/Alertness: Awake/alert Behavior During Therapy: WFL for tasks assessed/performed Overall Cognitive Status: Within Functional Limits for tasks assessed                      Exercises Total Joint Exercises Ankle Circles/Pumps: AROM;Both;15 reps;Supine Quad Sets: AROM;Both;15 reps;Supine Heel Slides: AAROM;Right;15 reps;Supine Hip ABduction/ADduction: AAROM;Right;15 reps;Supine Goniometric ROM: AAROM R knee -10- 45    General Comments        Pertinent Vitals/Pain Pain Assessment: 0-10 Pain Score: 4  Pain Location: R knee Pain Descriptors / Indicators: Aching;Sore Pain Intervention(s): Limited activity within patient's tolerance;Monitored during session;Premedicated before session;Ice applied    Home Living                      Prior Function            PT Goals (current goals can now be found in the care plan section) Acute Rehab PT Goals Patient Stated Goal: Regain IND PT Goal Formulation: With patient Time For Goal Achievement: 01/24/16 Potential to Achieve Goals: Fair Progress towards PT goals: Progressing toward goals    Frequency  7X/week    PT Plan Current plan remains appropriate    Co-evaluation             End of Session Equipment Utilized During Treatment: Gait belt;Right knee immobilizer Activity Tolerance: Patient limited by fatigue  Patient left: in bed;with call bell/phone within reach;with bed alarm set;with nursing/sitter in room     Time: [id number] PT Time Calculation (min) (ACUTE ONLY): 9 min  Charges:  $Gait Training: 8-22 mins $Therapeutic Exercise: 8-22 mins $Therapeutic Activity: 8-22 mins                    G Codes:      Vashti Bolanos 02/07/2016, 12:26 PM

## 2016-01-23 NOTE — Discharge Summary (Signed)
Physician Discharge Summary   Patient ID: Annette Mccullough MRN: [id number] DOB/AGE: 02/19/1946 70 y.o.  Admit date: 01/21/2016 Discharge date: tentative 01/23/2016  Primary Diagnosis: failed right total knee arthroplasty   Admission Diagnoses:  Past Medical History  Diagnosis Date  . Heart murmur     rec's antibiotic prior to dental work, never had any cardiac testing done  . Hypertension   . Blood transfusion     74- post childbirth & /w joint replacement   . Chronic kidney disease     renal calculi /w pregnancy  . Neuromuscular disorder ([place])     DDD- lumbar & cervical   . Arthritis     multiple areas  . Fibromyalgia   . Seasonal allergies   . Psoriasis   . Complication of anesthesia     post anesthesia - swelling of lip- relative to tape  . 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 Family history of adverse reaction to anesthesia     sister- slow to wake up and " has died once "    Discharge Diagnoses:   Principal Problem:   Failed total right knee replacement ([place]) Active Problems:   OA (osteoarthritis) of knee  Estimated body mass index is 41.81 kg/(m^2) as calculated from the following:   Height as of this encounter: 5' 7" (1.702 m).   Weight as of this encounter: 121.11 kg (267 lb).  Procedure:  Procedure(s) (LRB): RIGHT TOTAL KNEE REVISION (Right)   Consults: None  HPI: The patient is a 70 year old female who presented with knee complaints. She has had problems with her knee for close to a year now. Pain is about 5 to 6 cm below the knee in the anterior tibia. It is worse with weightbearing, but still occurring some at rest. She is not having any significant swelling in the knee. She is now almost 17 years out from a total knee arthroplasty. She had done well until now. Did not have any problems with infection or other periprosthetic abnormalities. She is having a lot of low back pain. That has been chronic. She does not get  any anterior hip pain or groin pain. AP of both knees, lateral of the right show a cementless prosthesis. She has got a tibial base plate with screws in it. Unfortunately, at the bottom of the screws is a large cystic lesion, which is somewhat thinning the posterior cortex of the tibia. 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 At this point, the most predictable means of improving pain and function is revision total knee arthroplasty. The procedure, risks, potential complications and rehab course are discussed in detail and the patient elects to proceed. Although she is not hurting in the knee itself, she is hurting around the area where the cyst is. 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 The only mechanism to solve this is to revise the knee. Replacing this with a new polyethylene does not address the root cause of this problem. We discussed this in detail. In order to fix this, she is going to need a total knee arthroplasty revision. I feel it is medically necessary to do this as the osteolysis is going to get worse and lead to a fracture in the future. They have been treated conservatively in the past  for the above stated problem and despite conservative measures, they continue to have progressive pain and severe functional limitations and dysfunction. They have failed non-operative management. It is felt that they would benefit from undergoing revision total joint replacement. Risks and benefits of the procedure have been discussed with the patient and they elect to proceed with surgery. There are no active contraindications to surgery such as ongoing infection or rapidly progressive neurological disease.  Laboratory Data: Admission on 01/21/2016  Component Date Value Ref Range Status  . WBC 01/22/2016 14.3* 4.0 - 10.5 K/uL Final  . RBC 01/22/2016 3.72* 3.87  - 5.11 MIL/uL Final  . Hemoglobin 01/22/2016 11.6* 12.0 - 15.0 g/dL Final  . HCT 01/22/2016 34.5* 36.0 - 46.0 % Final  . MCV 01/22/2016 92.7  78.0 - 100.0 fL Final  . MCH 01/22/2016 31.2  26.0 - 34.0 pg Final  . MCHC 01/22/2016 33.6  30.0 - 36.0 g/dL Final  . RDW 01/22/2016 13.4  11.5 - 15.5 % Final  . Platelets 01/22/2016 260  150 - 400 K/uL Final  . Sodium 01/22/2016 140  135 - 145 mmol/L Final  . Potassium 01/22/2016 4.5  3.5 - 5.1 mmol/L Final  . Chloride 01/22/2016 104  101 - 111 mmol/L Final  . CO2 01/22/2016 29  22 - 32 mmol/L Final  . Glucose, Bld 01/22/2016 145* 65 - 99 mg/dL Final  . BUN 01/22/2016 17  6 - 20 mg/dL Final  . Creatinine, Ser 01/22/2016 0.62  0.44 - 1.00 mg/dL Final  . Calcium 01/22/2016 8.9  8.9 - 10.3 mg/dL Final  . GFR calc non Af Amer 01/22/2016 >60  >60 mL/min Final  . GFR calc Af Amer 01/22/2016 >60  >60 mL/min Final   Comment: (NOTE) The eGFR has been calculated using the CKD EPI equation. This calculation has not been validated in all clinical situations. eGFR's persistently <60 mL/min signify possible Chronic Kidney Disease.   . Anion gap 01/22/2016 7  5 - 15 Final  . WBC 01/23/2016 12.6* 4.0 - 10.5 K/uL Final  . RBC 01/23/2016 3.28* 3.87 - 5.11 MIL/uL Final  . Hemoglobin 01/23/2016 10.5* 12.0 - 15.0 g/dL Final  . HCT 01/23/2016 30.6* 36.0 - 46.0 % Final  . MCV 01/23/2016 93.3  78.0 - 100.0 fL Final  . MCH 01/23/2016 32.0  26.0 - 34.0 pg Final  . MCHC 01/23/2016 34.3  30.0 - 36.0 g/dL Final  . RDW 01/23/2016 13.7  11.5 - 15.5 % Final  . Platelets 01/23/2016 281  150 - 400 K/uL Final  . Sodium 01/23/2016 139  135 - 145 mmol/L Final  . Potassium 01/23/2016 3.9  3.5 - 5.1 mmol/L Final  . Chloride 01/23/2016 101  101 - 111 mmol/L Final  . CO2 01/23/2016 27  22 - 32 mmol/L Final  . Glucose, Bld 01/23/2016 139* 65 - 99 mg/dL Final  . BUN 01/23/2016 15  6 - 20 mg/dL Final  . Creatinine, Ser 01/23/2016 0.74  0.44 - 1.00 mg/dL Final  . Calcium  01/23/2016 9.2  8.9 - 10.3 mg/dL Final  . GFR calc non Af Amer 01/23/2016 >60  >60 mL/min Final  . GFR calc Af Amer 01/23/2016 >60  >60 mL/min Final   Comment: (NOTE) The eGFR has been calculated using the CKD EPI equation. This calculation has not been validated in all clinical situations. eGFR's persistently <60 mL/min signify possible Chronic Kidney Disease.   Georgiann Hahn gap 01/23/2016 11  5 - 15 Final  Hospital Outpatient  Visit on 01/13/2016  Component Date Value Ref Range Status  . aPTT 01/13/2016 30  24 - 37 seconds Final  . WBC 01/13/2016 9.4  4.0 - 10.5 K/uL Final  . RBC 01/13/2016 4.04  3.87 - 5.11 MIL/uL Final  . Hemoglobin 01/13/2016 12.7  12.0 - 15.0 g/dL Final  . HCT 01/13/2016 37.7  36.0 - 46.0 % Final  . MCV 01/13/2016 93.3  78.0 - 100.0 fL Final  . MCH 01/13/2016 31.4  26.0 - 34.0 pg Final  . MCHC 01/13/2016 33.7  30.0 - 36.0 g/dL Final  . RDW 01/13/2016 13.5  11.5 - 15.5 % Final  . Platelets 01/13/2016 295  150 - 400 K/uL Final  . Sodium 01/13/2016 142  135 - 145 mmol/L Final  . Potassium 01/13/2016 5.1  3.5 - 5.1 mmol/L Final  . Chloride 01/13/2016 103  101 - 111 mmol/L Final  . CO2 01/13/2016 30  22 - 32 mmol/L Final  . Glucose, Bld 01/13/2016 99  65 - 99 mg/dL Final  . BUN 01/13/2016 23* 6 - 20 mg/dL Final  . Creatinine, Ser 01/13/2016 0.76  0.44 - 1.00 mg/dL Final  . Calcium 01/13/2016 10.1  8.9 - 10.3 mg/dL Final  . Total Protein 01/13/2016 7.3  6.5 - 8.1 g/dL Final  . Albumin 01/13/2016 3.9  3.5 - 5.0 g/dL Final  . AST 01/13/2016 20  15 - 41 U/L Final  . ALT 01/13/2016 19  14 - 54 U/L Final  . Alkaline Phosphatase 01/13/2016 87  38 - 126 U/L Final  . Total Bilirubin 01/13/2016 0.3  0.3 - 1.2 mg/dL Final  . GFR calc non Af Amer 01/13/2016 >60  >60 mL/min Final  . GFR calc Af Amer 01/13/2016 >60  >60 mL/min Final   Comment: (NOTE) The eGFR has been calculated using the CKD EPI equation. This calculation has not been validated in all clinical  situations. eGFR's persistently <60 mL/min signify possible Chronic Kidney Disease.   . Anion gap 01/13/2016 9  5 - 15 Final  . Prothrombin Time 01/13/2016 14.0  11.6 - 15.2 seconds Final  . INR 01/13/2016 1.06  0.00 - 1.49 Final  . ABO/RH(D) 01/13/2016 A POS   Final  . Antibody Screen 01/13/2016 NEG   Final  . Sample Expiration 01/13/2016 01/24/2016   Final  . Extend sample reason 01/13/2016 NO TRANSFUSIONS OR PREGNANCY IN THE PAST 3 MONTHS   Final  . Color, Urine 01/13/2016 YELLOW  YELLOW Final  . APPearance 01/13/2016 CLOUDY* CLEAR Final  . Specific Gravity, Urine 01/13/2016 1.027  1.005 - 1.030 Final  . pH 01/13/2016 5.5  5.0 - 8.0 Final  . Glucose, UA 01/13/2016 NEGATIVE  NEGATIVE mg/dL Final  . Hgb urine dipstick 01/13/2016 NEGATIVE  NEGATIVE Final  . Bilirubin Urine 01/13/2016 NEGATIVE  NEGATIVE Final  . Ketones, ur 01/13/2016 NEGATIVE  NEGATIVE mg/dL Final  . Protein, ur 01/13/2016 NEGATIVE  NEGATIVE mg/dL Final  . Nitrite 01/13/2016 NEGATIVE  NEGATIVE Final  . Leukocytes, UA 01/13/2016 MODERATE* NEGATIVE Final  . MRSA, PCR 01/13/2016 NEGATIVE  NEGATIVE Final  . Staphylococcus aureus 01/13/2016 NEGATIVE  NEGATIVE Final   Comment:        The Xpert SA Assay (FDA approved for NASAL specimens in patients over 81 years of age), is one component of a comprehensive surveillance program.  Test performance has been validated by [facility] for patients greater than or equal to 41 year old. It is not intended to diagnose infection nor to guide  or monitor treatment.   . Squamous Epithelial / LPF 01/13/2016 6-30* NONE SEEN Final  . WBC, UA 01/13/2016 6-30  0 - 5 WBC/hpf Final  . RBC / HPF 01/13/2016 0-5  0 - 5 RBC/hpf Final  . Bacteria, UA 01/13/2016 MANY* NONE SEEN Final  . Urine-Other 01/13/2016 MUCOUS PRESENT   Final  . ABO/RH(D) 01/13/2016 A POS   Final     Hospital Course: ANASTON KOEHN is a 70 y.o. who was admitted to [facility]. They were brought to the  operating room on 01/21/2016 and underwent Procedure(s): RIGHT TOTAL KNEE REVISION.  Patient tolerated the procedure well and was later transferred to the recovery room and then to the orthopaedic floor for postoperative care.  They were given PO and IV analgesics for pain control following their surgery.  They were given 24 hours of postoperative antibiotics of  Anti-infectives    Start     Dose/Rate Route Frequency Ordered Stop   01/21/16 2200  ceFAZolin (ANCEF) IVPB 2g/100 mL premix     2 g 200 mL/hr over 30 Minutes Intravenous Every 6 hours 01/21/16 1951 01/22/16 0443   01/21/16 0600  ceFAZolin (ANCEF) 3 g in dextrose 5 % 50 mL IVPB     3 g 130 mL/hr over 30 Minutes Intravenous On call to O.R. 01/20/16 1319 01/21/16 1528     and started on DVT prophylaxis in the form of Xarelto.   PT and OT were ordered for total joint protocol.  Discharge planning consulted to help with postop disposition and equipment needs.  Patient had a fair night on the evening of surgery.  They started to get up OOB with therapy on day one. Hemovac drain was pulled without difficulty.  Progressed slowly due to limited mobility secondary to pain and obesity. Continued to work with therapy into day two.  Dressing was changed on day two and the incision was healing but with slight bloody drainage.  Plan for post op day two was to continue with therapy and DC to SNF.    Diet: Cardiac diet Activity:WBAT Follow-up:in 2 weeks Disposition - Skilled nursing facility Discharged Condition: stable   Discharge Instructions    Call MD / Call 911    Complete by:  As directed   If you experience chest pain or shortness of breath, CALL 911 and be transported to the hospital emergency room.  If you develope a fever above 101 F, pus (white drainage) or increased drainage or redness at the wound, or calf pain, call your surgeon's office.     Constipation Prevention    Complete by:  As directed   Drink plenty of fluids.  Prune juice  may be helpful.  You may use a stool softener, such as Colace (over the counter) 100 mg twice a day.  Use MiraLax (over the counter) for constipation as needed.     Diet - low sodium heart healthy    Complete by:  As directed      Discharge instructions    Complete by:  As directed   Dr. Gaynelle Arabian Total Joint Specialist [facility] [address] [place], [place] [address] [phone]  TOTAL KNEE REPLACEMENT POSTOPERATIVE DIRECTIONS  Knee Rehabilitation, Guidelines Following Surgery  Results after knee surgery are often greatly improved when you follow the exercise, range of motion and muscle strengthening exercises prescribed by your doctor. Safety measures are also important to protect the knee from further injury. Any time any of these exercises  cause you to have increased pain or swelling in your knee joint, decrease the amount until you are comfortable again and slowly increase them. If you have problems or questions, call your caregiver or physical therapist for advice.   HOME CARE INSTRUCTIONS  Remove items at home which could result in a fall. This includes throw rugs or furniture in walking pathways.  ICE to the affected knee every three hours for 30 minutes at a time and then as needed for pain and swelling.  Continue to use ice on the knee for pain and swelling from surgery. You may notice swelling that will progress down to the foot and ankle.  This is normal after surgery.  Elevate the leg when you are not up walking on it.   Continue to use the breathing machine which will help keep your temperature down.  It is common for your temperature to cycle up and down following surgery, especially at night when you are not up moving around and exerting yourself.  The breathing machine keeps your lungs expanded and your temperature down. Do not place pillow under knee, focus on keeping the knee straight while resting  DIET You may resume your previous home  diet once your are discharged from the hospital.  DRESSING / WOUND CARE / SHOWERING You may start showering once you are discharged home but do not submerge the incision under water. Just pat the incision dry and apply a dry gauze dressing on daily. Change the surgical dressing daily and reapply a dry dressing each time.  ACTIVITY Walk with your walker as instructed. Use walker as long as suggested by your caregivers. Avoid periods of inactivity such as sitting longer than an hour when not asleep. This helps prevent blood clots.  You may resume a sexual relationship in one month or when given the OK by your doctor.  You may return to work once you are cleared by your doctor.  Do not drive a car for 6 weeks or until released by you surgeon.  Do not drive while taking narcotics.  WEIGHT BEARING Weight bearing as tolerated with assist device (walker, cane, etc) as directed, use it as long as suggested by your surgeon or therapist, typically at least 4-6 weeks.  POSTOPERATIVE CONSTIPATION PROTOCOL Constipation - defined medically as fewer than three stools per week and severe constipation as less than one stool per week.  One of the most common issues patients have following surgery is constipation.  Even if you have a regular bowel pattern at home, your normal regimen is likely to be disrupted due to multiple reasons following surgery.  Combination of anesthesia, postoperative narcotics, change in appetite and fluid intake all can affect your bowels.  In order to avoid complications following surgery, here are some recommendations in order to help you during your recovery period.  Colace (docusate) - Pick up an over-the-counter form of Colace or another stool softener and take twice a day as long as you are requiring postoperative pain medications.  Take with a full glass of water daily.  If you experience loose stools or diarrhea, hold the colace until you stool forms back up.  If your symptoms  do not get better within 1 week or if they get worse, check with your doctor.  Dulcolax (bisacodyl) - Pick up over-the-counter and take as directed by the product packaging as needed to assist with the movement of your bowels.  Take with a full glass of water.  Use this product as  needed if not relieved by Colace only.   MiraLax (polyethylene glycol) - Pick up over-the-counter to have on hand.  MiraLax is a solution that will increase the amount of water in your bowels to assist with bowel movements.  Take as directed and can mix with a glass of water, juice, soda, coffee, or tea.  Take if you go more than two days without a movement. Do not use MiraLax more than once per day. Call your doctor if you are still constipated or irregular after using this medication for 7 days in a row.  If you continue to have problems with postoperative constipation, please contact the office for further assistance and recommendations.  If you experience "the worst abdominal pain ever" or develop nausea or vomiting, please contact the office immediatly for further recommendations for treatment.  ITCHING  If you experience itching with your medications, try taking only a single pain pill, or even half a pain pill at a time.  You can also use Benadryl over the counter for itching or also to help with sleep.   TED HOSE STOCKINGS Wear the elastic stockings on both legs for three weeks following surgery during the day but you may remove then at night for sleeping.  MEDICATIONS See your medication summary on the "After Visit Summary" that the nursing staff will review with you prior to discharge.  You may have some home medications which will be placed on hold until you complete the course of blood thinner medication.  It is important for you to complete the blood thinner medication as prescribed by your surgeon.  Continue your approved medications as instructed at time of discharge.  PRECAUTIONS If you experience chest  pain or shortness of breath - call 911 immediately for transfer to the hospital emergency department.  If you develop a fever greater that 101 F, purulent drainage from wound, increased redness or drainage from wound, foul odor from the wound/dressing, or calf pain - CONTACT YOUR SURGEON.                                                   FOLLOW-UP APPOINTMENTS Make sure you keep all of your appointments after your operation with your surgeon and caregivers. You should call the office at the above phone number and make an appointment for approximately two weeks after the date of your surgery or on the date instructed by your surgeon outlined in the "After Visit Summary".   RANGE OF MOTION AND STRENGTHENING EXERCISES  Rehabilitation of the knee is important following a knee injury or an operation. After just a few days of immobilization, the muscles of the thigh which control the knee become weakened and shrink (atrophy). Knee exercises are designed to build up the tone and strength of the thigh muscles and to improve knee motion. Often times heat used for twenty to thirty minutes before working out will loosen up your tissues and help with improving the range of motion but do not use heat for the first two weeks following surgery. These exercises can be done on a training (exercise) mat, on the floor, on a table or on a bed. Use what ever works the best and is most comfortable for you Knee exercises include:  Leg Lifts - While your knee is still immobilized in a splint or cast, you can do straight  leg raises. Lift the leg to 60 degrees, hold for 3 sec, and slowly lower the leg. Repeat 10-20 times 2-3 times daily. Perform this exercise against resistance later as your knee gets better.  Quad and Hamstring Sets - Tighten up the muscle on the front of the thigh (Quad) and hold for 5-10 sec. Repeat this 10-20 times hourly. Hamstring sets are done by pushing the foot backward against an object and holding for  5-10 sec. Repeat as with quad sets.  Leg Slides: Lying on your back, slowly slide your foot toward your buttocks, bending your knee up off the floor (only go as far as is comfortable). Then slowly slide your foot back down until your leg is flat on the floor again. Angel Wings: Lying on your back spread your legs to the side as far apart as you can without causing discomfort.  A rehabilitation program following serious knee injuries can speed recovery and prevent re-injury in the future due to weakened muscles. Contact your doctor or a physical therapist for more information on knee rehabilitation.   IF YOU ARE TRANSFERRED TO A SKILLED REHAB FACILITY If the patient is transferred to a skilled rehab facility following release from the hospital, a list of the current medications will be sent to the facility for the patient to continue.  When discharged from the skilled rehab facility, please have the facility set up the patient's [place] prior to being released. Also, the skilled facility will be responsible for providing the patient with their medications at time of release from the facility to include their pain medication, the muscle relaxants, and their blood thinner medication. If the patient is still at the rehab facility at time of the two week follow up appointment, the skilled rehab facility will also need to assist the patient in arranging follow up appointment in our office and any transportation needs.  MAKE SURE YOU:  Understand these instructions.  Get help right away if you are not doing well or get worse.    Pick up stool softner and laxative for home use following surgery while on pain medications. Do not submerge incision under water. Please use good hand washing techniques while changing dressing each day. May shower starting three days after surgery. Please use a clean towel to pat the incision dry following showers. Continue to use ice for pain and swelling  after surgery. Do not use any lotions or creams on the incision until instructed by your surgeon.  Information on my medicine - XARELTO (Rivaroxaban)  This medication education was reviewed with me or my healthcare representative as part of my discharge preparation.  The pharmacist that spoke with me during my hospital stay was:  Rudean Haskell, [facility]  Why was Xarelto prescribed for you? Xarelto was prescribed for you to reduce the risk of blood clots forming after orthopedic surgery. The medical term for these abnormal blood clots is venous thromboembolism (VTE).  What do you need to know about xarelto ? Take your Xarelto ONCE DAILY at the same time every day. You may take it either with or without food.  If you have difficulty swallowing the tablet whole, you may crush it and mix in applesauce just prior to taking your dose.  Take Xarelto exactly as prescribed by your doctor and DO NOT stop taking Xarelto without talking to the doctor who prescribed the medication.  Stopping without other VTE prevention medication to take the place of Xarelto may increase your risk of  developing a clot.  After discharge, you should have regular check-up appointments with your healthcare provider that is prescribing your Xarelto.    What do you do if you miss a dose? If you miss a dose, take it as soon as you remember on the same day then continue your regularly scheduled once daily regimen the next day. Do not take two doses of Xarelto on the same day.   Important Safety Information A possible side effect of Xarelto is bleeding. You should call your healthcare provider right away if you experience any of the following: Bleeding from an injury or your nose that does not stop. Unusual colored urine (red or dark brown) or unusual colored stools (red or black). Unusual bruising for unknown reasons. A serious fall or if you hit your head (even if there is no bleeding).  Some medicines may  interact with Xarelto and might increase your risk of bleeding while on Xarelto. To help avoid this, consult your healthcare provider or pharmacist prior to using any new prescription or non-prescription medications, including herbals, vitamins, non-steroidal anti-inflammatory drugs (NSAIDs) and supplements.  This website has more information on Xarelto: [email or web address]     Increase activity slowly as tolerated    Complete by:  As directed             Medication List    STOP taking these medications        aspirin EC 81 MG tablet     cyclobenzaprine 10 MG tablet  Commonly known as:  FLEXERIL     oxyCODONE-acetaminophen 10-325 MG tablet  Commonly known as:  PERCOCET      TAKE these medications        docusate sodium 100 MG capsule  Commonly known as:  COLACE  Take 100 mg by mouth 3 (three) times daily.     DULoxetine 60 MG capsule  Commonly known as:  CYMBALTA  Take 60 mg by mouth daily.     enalapril 20 MG tablet  Commonly known as:  VASOTEC  Take 20 mg by mouth every morning.     hydrochlorothiazide 25 MG tablet  Commonly known as:  HYDRODIURIL  Take 25 mg by mouth every morning.     hydroxypropyl methylcellulose / hypromellose 2.5 % ophthalmic solution  Commonly known as:  ISOPTO TEARS / GONIOVISC  Place 2 drops into both eyes 4 (four) times daily as needed (for dry eyes).     latanoprost 0.005 % ophthalmic solution  Commonly known as:  XALATAN  1 drop at bedtime.     loratadine 10 MG tablet  Commonly known as:  CLARITIN  Take 10 mg by mouth daily as needed for allergies.     methocarbamol 500 MG tablet  Commonly known as:  ROBAXIN  Take 1 tablet (500 mg total) by mouth every 6 (six) hours as needed for muscle spasms.     metoprolol succinate 25 MG 24 hr tablet  Commonly known as:  TOPROL-XL  Take 25 mg by mouth 2 (two) times daily.     oxyCODONE 5 MG immediate release tablet  Commonly known as:  Oxy IR/ROXICODONE  Take 1-2 tablets (5-10 mg total)  by mouth every 3 (three) hours as needed for breakthrough pain.     rivaroxaban 10 MG Tabs tablet  Commonly known as:  XARELTO  Take 1 tablet (10 mg total) by mouth daily with breakfast.           Follow-up Information    Follow up with  Gearlean Alf, MD. Schedule an appointment as soon as possible for a visit on 02/03/2016.   Specialty:  Orthopedic Surgery   Why:  Call [phone] tomorrow to make the appointment   Contact information:   [address] [place] [address] [phone]     Dressing changes: Use only sterile gauze and secure with ACE wrap. No tape!  Signed: Ardeen Jourdain, PA-C Orthopaedic Surgery 01/23/2016, 7:44 AM

## 2016-01-23 NOTE — Progress Notes (Signed)
Physical Therapy Treatment Patient Details Name: Annette Mccullough MRN: [id number] DOB: 07/25/1946 Today's Date: 01/23/2016    History of Present Illness R TKR revision.  PT with Hx of CVAs with residual L side weakness, Cervical fusion and spinal cord stimulator placement    PT Comments    Pt progressing slowly with mobility.  Pt is motivated but ltd by obesity and residual L side weakness 2* CVA.  Follow Up Recommendations  SNF     Equipment Recommendations  None recommended by PT    Recommendations for Other Services OT consult     Precautions / Restrictions Precautions Precautions: Fall Required Braces or Orthoses: Knee Immobilizer - Right Knee Immobilizer - Right: Discontinue once straight leg raise with < 10 degree lag Restrictions Weight Bearing Restrictions: No LLE Weight Bearing: Weight bearing as tolerated    Mobility  Bed Mobility Overal bed mobility: Needs Assistance Bed Mobility: Supine to Sit     Supine to sit: Mod assist     General bed mobility comments: cues for sequence and use of L LE to self assist  Transfers Overall transfer level: Needs assistance Equipment used: Rolling walker (2 wheeled) Transfers: Sit to/from Stand Sit to Stand: Mod assist;+2 physical assistance;From elevated surface Stand pivot transfers: Mod assist;+2 physical assistance       General transfer comment: cues for LE management and use of UEs to self assist;  Pt from EOB,  to/from comode and to recliner  Ambulation/Gait Ambulation/Gait assistance: Mod assist;+2 physical assistance;+2 safety/equipment Ambulation Distance (Feet): 8 Feet (and 3') Assistive device: Rolling walker (2 wheeled) Gait Pattern/deviations: Step-to pattern;Decreased step length - right;Decreased step length - left;Shuffle;Trunk flexed Gait velocity: decr   General Gait Details: Increased time, cues for sequence, posture and position from RW.  Increased difficulty advancing L LE vs R     Stairs            Wheelchair Mobility    Modified Rankin (Stroke Patients Only)       Balance                                    Cognition Arousal/Alertness: Awake/alert Behavior During Therapy: WFL for tasks assessed/performed Overall Cognitive Status: Within Functional Limits for tasks assessed                      Exercises Total Joint Exercises Ankle Circles/Pumps: AROM;Both;15 reps;Supine Quad Sets: AROM;Both;15 reps;Supine Heel Slides: AAROM;Right;15 reps;Supine Hip ABduction/ADduction: AAROM;Right;15 reps;Supine Goniometric ROM: AAROM R knee -10- 45    General Comments        Pertinent Vitals/Pain Pain Assessment: 0-10 Pain Score: 5  Pain Location: R knee Pain Descriptors / Indicators: Aching;Sore Pain Intervention(s): Limited activity within patient's tolerance;Monitored during session;Premedicated before session;Ice applied    Home Living                      Prior Function            PT Goals (current goals can now be found in the care plan section) Acute Rehab PT Goals Patient Stated Goal: Regain IND PT Goal Formulation: With patient Time For Goal Achievement: 01/24/16 Potential to Achieve Goals: Fair Progress towards PT goals: Progressing toward goals    Frequency  7X/week    PT Plan Current plan remains appropriate    Co-evaluation  End of Session Equipment Utilized During Treatment: Gait belt;Right knee immobilizer Activity Tolerance: Patient limited by fatigue Patient left: in chair;with call bell/phone within reach;with chair alarm set     Time: [id number] PT Time Calculation (min) (ACUTE ONLY): 57 min  Charges:  $Gait Training: 8-22 mins $Therapeutic Exercise: 8-22 mins $Therapeutic Activity: 8-22 mins                    G Codes:      Edgel Degnan 2016-02-22, 12:20 PM

## 2016-01-24 LAB — CBC
HCT: 27 % — ABNORMAL LOW (ref 36.0–46.0)
Hemoglobin: 9.2 g/dL — ABNORMAL LOW (ref 12.0–15.0)
MCH: 31.8 pg (ref 26.0–34.0)
MCHC: 34.1 g/dL (ref 30.0–36.0)
MCV: 93.4 fL (ref 78.0–100.0)
Platelets: 243 10*3/uL (ref 150–400)
RBC: 2.89 MIL/uL — ABNORMAL LOW (ref 3.87–5.11)
RDW: 13.8 % (ref 11.5–15.5)
WBC: 12.9 10*3/uL — ABNORMAL HIGH (ref 4.0–10.5)

## 2016-01-24 NOTE — Progress Notes (Signed)
Report called to Jaymes Graff, RN at [facility]. Patient being discharged to facility via [facility]

## 2016-01-24 NOTE — Care Management Note (Addendum)
Case Management Note  Patient Details  Name: Annette Mccullough MRN: [id number] Date of Birth: 06/06/1946  Subjective/Objective:         RIGHT TOTAL KNEE REVISION           Action/Plan: Discharge Planning: AVS reviewed: Spoke to pt at bedside. Scheduled dc to [facility] for rehab. CSW following for placement.   Expected Discharge Date:  01/24/2016               Expected Discharge Plan:  [place]  In-House Referral:  Clinical Social Work  Discharge planning Services  NA  Post Acute Care Choice:  NA Choice offered to:  NA  DME Arranged:  N/A DME Agency:  NA  HH Arranged:  NA HH Agency:  NA  Status of Service:  Completed, signed off  Medicare Important Message Given:  Yes Date Medicare IM Given:    Medicare IM give by:    Date Additional Medicare IM Given:    Additional Medicare Important Message give by:     If discussed at [place] of Stay Meetings, dates discussed:    Additional Comments:  Erenest Rasher, RN 01/24/2016, 10:01 AM

## 2016-01-24 NOTE — Progress Notes (Signed)
Physical Therapy Treatment Patient Details Name: Annette Mccullough MRN: [id number] DOB: [birth year] Today's Date: 02/20/16    History of Present Illness R TKR revision.  PT with Hx of CVAs with residual L side weakness, Cervical fusion and spinal cord stimulator placement    PT Comments    Therex program performed.  Ambulation deferred at pt request 2* fatigue.  Will follow up in pm  Follow Up Recommendations  SNF     Equipment Recommendations  None recommended by PT    Recommendations for Other Services OT consult     Precautions / Restrictions Precautions Precautions: Fall Knee Immobilizer - Right: Discontinue once straight leg raise with < 10 degree lag Restrictions Weight Bearing Restrictions: No LLE Weight Bearing: Weight bearing as tolerated Other Position/Activity Restrictions: WBAT    Mobility  Bed Mobility               General bed mobility comments: OOB with nursing  Transfers                    Ambulation/Gait                 Stairs            Wheelchair Mobility    Modified Rankin (Stroke Patients Only)       Balance                                    Cognition Arousal/Alertness: Awake/alert Behavior During Therapy: WFL for tasks assessed/performed Overall Cognitive Status: Within Functional Limits for tasks assessed                      Exercises Total Joint Exercises Ankle Circles/Pumps: AROM;Both;15 reps;Supine Quad Sets: AROM;Both;15 reps;Supine Heel Slides: AAROM;Right;15 reps;Supine Straight Leg Raises: AAROM;Right;15 reps;Supine Goniometric ROM: AAROM -10 - 60    General Comments        Pertinent Vitals/Pain Pain Assessment: 0-10 Pain Score: 6  Pain Location: R knee Pain Descriptors / Indicators: Aching;Sore Pain Intervention(s): Limited activity within patient's tolerance;Monitored during session;Premedicated before session;Ice applied    Home Living                      Prior Function            PT Goals (current goals can now be found in the care plan section) Acute Rehab PT Goals Patient Stated Goal: Regain IND PT Goal Formulation: With patient Time For Goal Achievement: 02-20-16 Potential to Achieve Goals: Fair Progress towards PT goals: Progressing toward goals    Frequency  7X/week    PT Plan Current plan remains appropriate    Co-evaluation             End of Session   Activity Tolerance: Patient limited by fatigue Patient left: in chair;with call bell/phone within reach     Time: 0951-1017 PT Time Calculation (min) (ACUTE ONLY): 26 min  Charges:  $Therapeutic Exercise: 23-37 mins                    G Codes:      Annette Mccullough 20-Feb-2016, 12:58 PM

## 2016-01-24 NOTE — Clinical Social Work Placement (Signed)
   CLINICAL SOCIAL WORK PLACEMENT  NOTE 01/24/16 - DISCHARGE TO [facility]  Date:  01/24/2016  Patient Details  Name: Annette Mccullough MRN: [id number] Date of Birth: 05/03/1946  Clinical Social Work is seeking post-discharge placement for this patient at the [place] level of care (*CSW will initial, date and re-position this form in  chart as items are completed):  Yes   Patient/family provided with [place] Work Department's list of facilities offering this level of care within the geographic area requested by the patient (or if unable, by the patient's family).  Yes   Patient/family informed of their freedom to choose among providers that offer the needed level of care, that participate in Medicare, Medicaid or managed care program needed by the patient, have an available bed and are willing to accept the patient.  Yes   Patient/family informed of [facility]'s ownership interest in [facility] and [facility], as well as of the fact that they are under no obligation to receive care at these facilities.  PASRR submitted to EDS on 01/22/16     PASRR number received on 01/22/16     Existing PASRR number confirmed on       FL2 transmitted to all facilities in geographic area requested by pt/family on 01/22/16     FL2 transmitted to all facilities within larger geographic area on       Patient informed that his/her managed care company has contracts with or will negotiate with certain facilities, including the following:        Yes   Patient/family informed of bed offers received.  Patient chooses bed at [facility]     Physician recommends and patient chooses bed at      Patient to be transferred to  [facility] on  01/24/16.  Patient to be transferred to facility by  ambulance     Patient family notified on  01/24/16 of transfer.  Name of family member notified:   Patient texted her son Minal Kniss while CSW in the room to  inform him of her leaving and going to facility.     PHYSICIAN Please sign FL2     Additional Comment:    _______________________________________________ Sable Feil, LCSW 01/24/2016, 1:55 PM

## 2016-01-24 NOTE — Care Management Important Message (Signed)
Important Message  Patient Details  Name: Annette Mccullough MRN: [id number] Date of Birth: July 09, 1946   Medicare Important Message Given:  Yes    Erenest Rasher, RN 01/24/2016, 9:46 AM

## 2016-01-24 NOTE — Progress Notes (Signed)
Patient ID: Annette Mccullough, female   DOB: 15-Mar-1946, 70 y.o.   MRN: [id number]    Subjective: 3 Days Post-Op Procedure(s) (LRB): RIGHT TOTAL KNEE REVISION (Right) Patient reports pain as 2 on 0-10 scale.   Denies CP or SOB.  Voiding without difficulty. Positive flatus. Objective: Vital signs in last 24 hours: Temp:  [97.9 F (36.6 C)-98.4 F (36.9 C)] 98.4 F (36.9 C) (04/22 0448) Pulse Rate:  [92-97] 92 (04/22 0448) Resp:  [14] 14 (04/22 0448) BP: (122-135)/(49-57) 122/57 mmHg (04/22 0448) SpO2:  [98 %-100 %] 100 % (04/22 0448)  Intake/Output from previous day: 04/21 0701 - 04/22 0700 In: 1190 [P.O.:1190] Out: 1650 [Urine:1650] Intake/Output this shift:    Labs:  Recent Labs  01/22/16 0410 01/23/16 0410 01/24/16 0405  HGB 11.6* 10.5* 9.2*    Recent Labs  01/23/16 0410 01/24/16 0405  WBC 12.6* 12.9*  RBC 3.28* 2.89*  HCT 30.6* 27.0*  PLT 281 243    Recent Labs  01/22/16 0410 01/23/16 0410  NA 140 139  K 4.5 3.9  CL 104 101  CO2 29 27  BUN 17 15  CREATININE 0.62 0.74  GLUCOSE 145* 139*  CALCIUM 8.9 9.2   No results for input(s): LABPT, INR in the last 72 hours.  Physical Exam: Neurologically intact ABD soft Sensation intact distally Incision: dressing C/D/I Compartment soft  Assessment/Plan: 3 Days Post-Op Procedure(s) (LRB): RIGHT TOTAL KNEE REVISION (Right) Discharge to SNF  Annette Mccullough, Annette Mccullough for Dr. Melina Schools [facility] [phone] 01/24/2016, 7:20 AM

## 2016-01-26 DIAGNOSIS — I10 Essential (primary) hypertension: Secondary | ICD-10-CM

## 2016-01-26 DIAGNOSIS — M1711 Unilateral primary osteoarthritis, right knee: Secondary | ICD-10-CM | POA: Diagnosis not present

## 2016-01-26 DIAGNOSIS — G8194 Hemiplegia, unspecified affecting left nondominant side: Secondary | ICD-10-CM | POA: Diagnosis not present

## 2016-01-26 DIAGNOSIS — M545 Low back pain: Secondary | ICD-10-CM | POA: Diagnosis not present

## 2016-01-30 DIAGNOSIS — Z96651 Presence of right artificial knee joint: Secondary | ICD-10-CM | POA: Diagnosis not present

## 2016-02-10 DIAGNOSIS — I10 Essential (primary) hypertension: Secondary | ICD-10-CM | POA: Diagnosis not present

## 2016-02-10 DIAGNOSIS — M545 Low back pain: Secondary | ICD-10-CM | POA: Diagnosis not present

## 2016-02-10 DIAGNOSIS — M1711 Unilateral primary osteoarthritis, right knee: Secondary | ICD-10-CM | POA: Diagnosis not present

## 2016-02-10 DIAGNOSIS — G8194 Hemiplegia, unspecified affecting left nondominant side: Secondary | ICD-10-CM

## 2019-12-02 ENCOUNTER — Ambulatory Visit: Payer: Medicare Other | Attending: Internal Medicine

## 2019-12-02 DIAGNOSIS — Z23 Encounter for immunization: Secondary | ICD-10-CM | POA: Insufficient documentation

## 2019-12-02 NOTE — Progress Notes (Signed)
   Covid-19 Vaccination Clinic  Name:  TALEEYAH AZCARATE    MRN: [id number] DOB: 06-04-1946  12/02/2019  Ms. Matlick was observed post Covid-19 immunization for 15 minutes without incidence. She was provided with Vaccine Information Sheet and instruction to access the V-Safe system.   Ms. Sabados was instructed to call 911 with any severe reactions post vaccine: Marland Kitchen Difficulty breathing  . Swelling of your face and throat  . A fast heartbeat  . A bad rash all over your body  . Dizziness and weakness    Immunizations Administered    Name Date Dose VIS Date Route   Pfizer COVID-19 Vaccine 12/02/2019  2:43 PM 0.3 mL 09/14/2019 Intramuscular   Manufacturer: [place]   Lot: [id number]   [place]: [id number]

## 2019-12-25 ENCOUNTER — Ambulatory Visit: Payer: Medicare Other | Attending: Internal Medicine

## 2019-12-25 DIAGNOSIS — Z23 Encounter for immunization: Secondary | ICD-10-CM

## 2019-12-25 NOTE — Progress Notes (Signed)
   Covid-19 Vaccination Clinic  Name:  Annette Mccullough    MRN: [id number] DOB: 1946/07/15  12/25/2019  [name] was observed post Covid-19 immunization for 15 minutes without incident. She was provided with Vaccine Information Sheet and instruction to access the V-Safe system.   [name] was instructed to call 911 with any severe reactions post vaccine: Marland Kitchen Difficulty breathing  . Swelling of face and throat  . A fast heartbeat  . A bad rash all over body  . Dizziness and weakness   Immunizations Administered    Name Date Dose VIS Date Route   Pfizer COVID-19 Vaccine 12/25/2019 11:29 AM 0.3 mL 09/14/2019 Intramuscular   Manufacturer: [place]   Lot: [id number]   [place]: [id number]

## 2020-05-05 ENCOUNTER — Other Ambulatory Visit
Admission: RE | Admit: 2020-05-05 | Discharge: 2020-05-05 | Disposition: A | Discharge status: Home / Self Care | Payer: Medicare Other | Source: Ambulatory Visit | Attending: General Surgery | Admitting: General Surgery

## 2020-05-05 ENCOUNTER — Other Ambulatory Visit: Payer: Self-pay

## 2020-05-05 DIAGNOSIS — Z20822 Contact with and (suspected) exposure to covid-19: Secondary | ICD-10-CM | POA: Insufficient documentation

## 2020-05-05 DIAGNOSIS — Z01812 Encounter for preprocedural laboratory examination: Secondary | ICD-10-CM | POA: Insufficient documentation

## 2020-05-06 LAB — SARS CORONAVIRUS 2 (TAT 6-24 HRS): SARS Coronavirus 2: NEGATIVE

## 2020-05-07 ENCOUNTER — Ambulatory Visit: Payer: Medicare Other | Admitting: Certified Registered Nurse Anesthetist

## 2020-05-07 ENCOUNTER — Other Ambulatory Visit: Payer: Self-pay | Admitting: General Surgery

## 2020-05-07 ENCOUNTER — Encounter
Admission: RE | Disposition: A | Discharge status: Home / Self Care | Payer: Self-pay | Source: Home / Self Care | Attending: General Surgery

## 2020-05-07 ENCOUNTER — Ambulatory Visit
Admission: RE | Admit: 2020-05-07 | Discharge: 2020-05-07 | Disposition: A | Discharge status: Home / Self Care | Payer: Medicare Other | Attending: General Surgery | Admitting: General Surgery

## 2020-05-07 ENCOUNTER — Other Ambulatory Visit: Payer: Self-pay

## 2020-05-07 ENCOUNTER — Encounter: Payer: Self-pay | Admitting: General Surgery

## 2020-05-07 DIAGNOSIS — Z87891 Personal history of nicotine dependence: Secondary | ICD-10-CM | POA: Insufficient documentation

## 2020-05-07 DIAGNOSIS — Z7901 Long term (current) use of anticoagulants: Secondary | ICD-10-CM | POA: Insufficient documentation

## 2020-05-07 DIAGNOSIS — K3189 Other diseases of stomach and duodenum: Secondary | ICD-10-CM | POA: Insufficient documentation

## 2020-05-07 DIAGNOSIS — Z8673 Personal history of transient ischemic attack (TIA), and cerebral infarction without residual deficits: Secondary | ICD-10-CM | POA: Insufficient documentation

## 2020-05-07 DIAGNOSIS — M1990 Unspecified osteoarthritis, unspecified site: Secondary | ICD-10-CM | POA: Diagnosis not present

## 2020-05-07 DIAGNOSIS — C19 Malignant neoplasm of rectosigmoid junction: Secondary | ICD-10-CM | POA: Diagnosis not present

## 2020-05-07 DIAGNOSIS — K5669 Other partial intestinal obstruction: Secondary | ICD-10-CM | POA: Insufficient documentation

## 2020-05-07 DIAGNOSIS — C182 Malignant neoplasm of ascending colon: Secondary | ICD-10-CM

## 2020-05-07 DIAGNOSIS — K2970 Gastritis, unspecified, without bleeding: Secondary | ICD-10-CM | POA: Diagnosis not present

## 2020-05-07 DIAGNOSIS — Z91012 Allergy to eggs: Secondary | ICD-10-CM | POA: Diagnosis not present

## 2020-05-07 DIAGNOSIS — K5730 Diverticulosis of large intestine without perforation or abscess without bleeding: Secondary | ICD-10-CM | POA: Diagnosis not present

## 2020-05-07 DIAGNOSIS — G8194 Hemiplegia, unspecified affecting left nondominant side: Secondary | ICD-10-CM | POA: Insufficient documentation

## 2020-05-07 DIAGNOSIS — Z7982 Long term (current) use of aspirin: Secondary | ICD-10-CM | POA: Diagnosis not present

## 2020-05-07 DIAGNOSIS — Z9049 Acquired absence of other specified parts of digestive tract: Secondary | ICD-10-CM | POA: Diagnosis not present

## 2020-05-07 DIAGNOSIS — N189 Chronic kidney disease, unspecified: Secondary | ICD-10-CM | POA: Insufficient documentation

## 2020-05-07 DIAGNOSIS — D509 Iron deficiency anemia, unspecified: Secondary | ICD-10-CM | POA: Insufficient documentation

## 2020-05-07 DIAGNOSIS — Z888 Allergy status to other drugs, medicaments and biological substances status: Secondary | ICD-10-CM | POA: Insufficient documentation

## 2020-05-07 DIAGNOSIS — M797 Fibromyalgia: Secondary | ICD-10-CM | POA: Diagnosis not present

## 2020-05-07 DIAGNOSIS — I129 Hypertensive chronic kidney disease with stage 1 through stage 4 chronic kidney disease, or unspecified chronic kidney disease: Secondary | ICD-10-CM | POA: Diagnosis not present

## 2020-05-07 DIAGNOSIS — Z79899 Other long term (current) drug therapy: Secondary | ICD-10-CM | POA: Insufficient documentation

## 2020-05-07 HISTORY — PX: COLONOSCOPY WITH PROPOFOL: SHX5780

## 2020-05-07 HISTORY — PX: ESOPHAGOGASTRODUODENOSCOPY: SHX5428

## 2020-05-07 SURGERY — EGD (ESOPHAGOGASTRODUODENOSCOPY)
Anesthesia: General

## 2020-05-07 MED ORDER — SODIUM CHLORIDE 0.9 % IV SOLN: INTRAVENOUS | Status: DC

## 2020-05-07 MED ORDER — PROPOFOL 500 MG/50ML IV EMUL
INTRAVENOUS | Status: AC
  Filled 2020-05-07: qty 50

## 2020-05-07 MED ORDER — PROPOFOL 10 MG/ML IV BOLUS
INTRAVENOUS | Status: AC
  Filled 2020-05-07: qty 20

## 2020-05-07 MED ORDER — PROPOFOL 10 MG/ML IV BOLUS
INTRAVENOUS | Status: DC | PRN
  Administered 2020-05-07: 30 mg via INTRAVENOUS
  Administered 2020-05-07: 60 mg via INTRAVENOUS

## 2020-05-07 MED ORDER — PROPOFOL 500 MG/50ML IV EMUL
INTRAVENOUS | Status: DC | PRN
  Administered 2020-05-07: 175 ug/kg/min via INTRAVENOUS

## 2020-05-07 MED ORDER — LIDOCAINE HCL (CARDIAC) PF 100 MG/5ML IV SOSY
PREFILLED_SYRINGE | INTRAVENOUS | Status: DC | PRN
  Administered 2020-05-07: 50 mg via INTRAVENOUS

## 2020-05-07 MED ORDER — SPOT INK MARKER SYRINGE KIT
PACK | SUBMUCOSAL | Status: DC | PRN
  Administered 2020-05-07: 3 mL via SUBMUCOSAL

## 2020-05-07 NOTE — H&P (Signed)
FRAYA UEDA [id number] 11-14-1945     HPI:  74 y/o woman with gradually falling HGB, fairly stable MCV. Heme negative stool.  For upper and lower endoscopy.   Medications Prior to Admission  Medication Sig Dispense Refill Last Dose  . aspirin EC 81 MG tablet Take 81 mg by mouth daily. Swallow whole.   05/06/2020 at Unknown time  . DULoxetine (CYMBALTA) 60 MG capsule Take 60 mg by mouth daily.     05/07/2020 at Unknown time  . enalapril (VASOTEC) 20 MG tablet Take 20 mg by mouth every morning.    05/07/2020 at Unknown time  . hydrochlorothiazide (HYDRODIURIL) 25 MG tablet Take 25 mg by mouth every morning.    05/07/2020 at Unknown time  . methocarbamol (ROBAXIN) 500 MG tablet Take 1 tablet (500 mg total) by mouth every 6 (six) hours as needed for muscle spasms. 40 tablet 1 Past Week at Unknown time  . metoprolol succinate (TOPROL-XL) 25 MG 24 hr tablet Take 25 mg by mouth 2 (two) times daily.    05/07/2020 at Unknown time  . oxyCODONE (OXY IR/ROXICODONE) 5 MG immediate release tablet Take 1-2 tablets (5-10 mg total) by mouth every 3 (three) hours as needed for breakthrough pain. 80 tablet 0 Past Week at Unknown time  . docusate sodium (COLACE) 100 MG capsule Take 100 mg by mouth 3 (three) times daily.     . hydroxypropyl methylcellulose (ISOPTO TEARS) 2.5 % ophthalmic solution Place 2 drops into both eyes 4 (four) times daily as needed (for dry eyes).     Marland Kitchen latanoprost (XALATAN) 0.005 % ophthalmic solution 1 drop at bedtime.     Marland Kitchen loratadine (CLARITIN) 10 MG tablet Take 10 mg by mouth daily as needed for allergies.     . rivaroxaban (XARELTO) 10 MG TABS tablet Take 1 tablet (10 mg total) by mouth daily with breakfast. 20 tablet 0    Allergies  Allergen Reactions  . Eggs Or Egg-Derived Products Other (See Comments)    Pt. States she get abdominal discomfort if she eats fresh eggs  . Latex Swelling    Her allergy is not really to latex....more adhesive  . Tape Swelling    PAPER TAPE IS OK    Past Medical History:  Diagnosis Date  . Arthritis    multiple areas  . Blood transfusion    74- post childbirth & /w joint replacement   . Chronic kidney disease    renal calculi /w pregnancy  . Complication of anesthesia    post anesthesia - swelling of lip- relative to tape  . Family history of adverse reaction to anesthesia    sister- slow to wake up and " has died once "   . Fibromyalgia   . Heart murmur    rec's antibiotic prior to dental work, never had any cardiac testing done  . Hypertension   . Neuromuscular disorder ([place])    DDD- lumbar & cervical   . Psoriasis   . Seasonal allergies   . Stroke [facility])    1972 & 2005, 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- some weakness remains    Past Surgical History:  Procedure Laterality Date  . ABDOMINAL HYSTERECTOMY    . APPENDECTOMY    . CERVICAL FUSION     2011  . CESAREAN SECTION     2 times   . CYST EXCISION Right   . EYE SURGERY     bilateral cataract surgery   . JOINT REPLACEMENT     1999-  R knee  . LUMBAR WOUND DEBRIDEMENT  09/15/2011   Procedure: LUMBAR WOUND DEBRIDEMENT;  Surgeon: Dahlia Bailiff;  Location: [facility];  Service: Orthopedics;  Laterality: N/A;  REPEAT I&D AND WOUND CLOSURE OF SPINAL WOUND  . OVARIAN CYST REMOVAL    . SPINAL CORD STIMULATOR BATTERY EXCHANGE Left 02/07/2013   Procedure: REMOVAL OF HARDWARE (BATTERY REMOVAL);  Surgeon: Melina Schools, MD;  Location: [facility];  Service: Orthopedics;  Laterality: Left;  removal of spinal cord stimulator battery  . SPINAL CORD STIMULATOR INSERTION  08/25/2011   Procedure: LUMBAR SPINAL CORD STIMULATOR INSERTION;  Surgeon: Dahlia Bailiff;  Location: [facility];  Service: Orthopedics;  Laterality: N/A;  . TONSILLECTOMY     as a child  . TOTAL KNEE REVISION Right 01/21/2016   Procedure: RIGHT TOTAL KNEE REVISION;  Surgeon: Gaynelle Arabian, MD;  Location: [facility];  Service: Orthopedics;  Laterality: Right;  . TRIGGER FINGER RELEASE Right    Social History   Socioeconomic History  .  Marital status: Widowed    Spouse name: Not on file  . Number of children: Not on file  . Years of education: Not on file  . Highest education level: Not on file  Occupational History  . Not on file  Tobacco Use  . Smoking status: Former Smoker    Quit date: 08/17/2004    Years since quitting: 15.7  . Smokeless tobacco: Never Used  Vaping Use  . Vaping Use: Never used  Substance and Sexual Activity  . Alcohol use: Yes    Comment: social but rare use,none last 24hrs  . Drug use: No  . Sexual activity: Not on file  Other Topics Concern  . Not on file  Social History Narrative  . Not on file   Social Determinants of Health   Financial Resource Strain:   . Difficulty of Paying Living Expenses:   Food Insecurity:   . Worried About Charity fundraiser in the Last Year:   . Arboriculturist in the Last Year:   Transportation Needs:   . Film/video editor (Medical):   Marland Kitchen Lack of Transportation (Non-Medical):   Physical Activity:   . Days of Exercise per Week:   . Minutes of Exercise per Session:   Stress:   . Feeling of Stress :   Social Connections:   . Frequency of Communication with Friends and Family:   . Frequency of Social Gatherings with Friends and Family:   . Attends Religious Services:   . Active Member of Clubs or Organizations:   . Attends Archivist Meetings:   Marland Kitchen Marital Status:   Intimate Partner Violence:   . Fear of Current or Ex-Partner:   . Emotionally Abused:   Marland Kitchen Physically Abused:   . Sexually Abused:    Social History   Social History Narrative  . Not on file     ROS: Negative.     PE: HEENT: Negative. Lungs: Clear. Cardio: RR.  Plan: Upper and lower endoscopy.    Forest Gleason [facility] 05/07/2020

## 2020-05-07 NOTE — Anesthesia Postprocedure Evaluation (Signed)
Anesthesia Post Note  Patient: ASHELYN MCCRAVY  Procedure(s) Performed: ESOPHAGOGASTRODUODENOSCOPY (EGD) (N/A ) COLONOSCOPY WITH PROPOFOL (N/A )  Patient location during evaluation: Endoscopy Anesthesia Type: General Level of consciousness: awake and alert and oriented Pain management: pain level controlled Vital Signs Assessment: post-procedure vital signs reviewed and stable Respiratory status: spontaneous breathing, nonlabored ventilation and respiratory function stable Cardiovascular status: blood pressure returned to baseline and stable Postop Assessment: no signs of nausea or vomiting Anesthetic complications: no   No complications documented.   Last Vitals:  Vitals:   05/07/20 1122 05/07/20 1128  BP: 128/60 112/76  Pulse: 64 63  Resp: 16 18  Temp:    SpO2: 100% 100%    Last Pain:  Vitals:   05/07/20 1058  TempSrc: Temporal  PainSc:                  Yun Gutierrez

## 2020-05-07 NOTE — Op Note (Signed)
[facility] Patient Name: Annette Mccullough Procedure Date: 05/07/2020 9:54 AM MRN: [id number] Account #: [id number] Date of Birth: 1945/10/25 Admit Type: Outpatient Age: 74 Room: [facility] ENDO ROOM 1 Gender: Female Note Status: Finalized Procedure:             Upper GI endoscopy Indications:           Iron deficiency anemia Providers:             Robert Bellow, MD Referring MD:          Andres Labrum, MD (Referring MD) Medicines:             Monitored Anesthesia Care Complications:         No immediate complications. Procedure:             Pre-Anesthesia Assessment:                        - Prior to the procedure, a History and Physical was                         performed, and patient medications, allergies and                         sensitivities were reviewed. The patient's tolerance                         of previous anesthesia was reviewed.                        - The risks and benefits of the procedure and the                         sedation options and risks were discussed with the                         patient. All questions were answered and informed                         consent was obtained.                        After obtaining informed consent, the endoscope was                         passed under direct vision. Throughout the procedure,                         the patient's blood pressure, pulse, and oxygen                         saturations were monitored continuously. The Endoscope                         was introduced through the mouth, and advanced to the                         third part of duodenum. The upper GI endoscopy was  accomplished without difficulty. The patient tolerated                         the procedure well. Findings:      The esophagus was normal.      The stomach was normal.      Localized nodular mucosa was found in the duodenal bulb. Biopsies were       taken with a  cold forceps for histology. Impression:            - Normal esophagus.                        - Normal stomach.                        - Nodular mucosa in the duodenal bulb. Biopsied. Recommendation:        - Perform a colonoscopy today. Procedure Code(s):     --- Professional ---                        [id number], Esophagogastroduodenoscopy, flexible,                         transoral; with biopsy, single or multiple Diagnosis Code(s):     --- Professional ---                        K31.89, Other diseases of stomach and duodenum                        D50.9, Iron deficiency anemia, unspecified CPT copyright 2019 American Medical Association. All rights reserved. The codes documented in this report are preliminary and upon coder review may  be revised to meet current compliance requirements. Robert Bellow, MD 05/07/2020 10:16:54 AM This report has been signed electronically. Number of Addenda: 0 Note Initiated On: 05/07/2020 9:54 AM Estimated Blood Loss:  Estimated blood loss: none.      [facility]

## 2020-05-07 NOTE — Anesthesia Procedure Notes (Signed)
Date/Time: 05/07/2020 10:02 AM Performed by: Johnna Acosta, CRNA Pre-anesthesia Checklist: Patient identified, Emergency Drugs available, Suction available, Patient being monitored and Timeout performed Patient Re-evaluated:Patient Re-evaluated prior to induction Oxygen Delivery Method: Simple face mask Preoxygenation: Pre-oxygenation with 100% oxygen Induction Type: IV induction

## 2020-05-07 NOTE — Transfer of Care (Signed)
Immediate Anesthesia Transfer of Care Note  Patient: Annette Mccullough  Procedure(s) Performed: ESOPHAGOGASTRODUODENOSCOPY (EGD) (N/A ) COLONOSCOPY WITH PROPOFOL (N/A )  Patient Location: PACU  Anesthesia Type:General  Level of Consciousness: drowsy  Airway & Oxygen Therapy: Patient Spontanous Breathing  Post-op Assessment: Report given to RN and Post -op Vital signs reviewed and stable  Post vital signs: Reviewed and stable  Last Vitals:  Vitals Value Taken Time  BP 129/75 05/07/20 1055  Temp    Pulse 68 05/07/20 1057  Resp 18 05/07/20 1057  SpO2 100 % 05/07/20 1057  Vitals shown include unvalidated device data.  Last Pain:  Vitals:   05/07/20 0911  TempSrc: Temporal  PainSc: 8          Complications: No complications documented.

## 2020-05-07 NOTE — Anesthesia Preprocedure Evaluation (Signed)
Anesthesia Evaluation  Patient identified by MRN, date of birth, ID band Patient awake    Reviewed: Allergy & Precautions, NPO status , Patient's Chart, lab work & pertinent test results  History of Anesthesia Complications Negative for: history of anesthetic complications  Airway Mallampati: III  TM Distance: >3 FB Neck ROM: Full    Dental  (+) Upper Dentures, Missing   Pulmonary neg sleep apnea, neg COPD, former smoker,    breath sounds clear to auscultation- rhonchi (-) wheezing      Cardiovascular hypertension, Pt. on medications (-) CAD, (-) Past MI, (-) Cardiac Stents and (-) CABG  Rhythm:Regular Rate:Normal - Systolic murmurs and - Diastolic murmurs    Neuro/Psych CVA (L sided weakness), Residual Symptoms negative psych ROS   GI/Hepatic negative GI ROS, Neg liver ROS,   Endo/Other  negative endocrine ROSneg diabetes  Renal/GU Renal disease: hx of nephrolithiasis.     Musculoskeletal  (+) Arthritis , Fibromyalgia -  Abdominal (+) + obese,   Peds  Hematology negative hematology ROS (+)   Anesthesia Other Findings Past Medical History: No date: Arthritis     Comment:  multiple areas No date: Blood transfusion     Comment:  30- post childbirth & /w joint replacement  No date: Chronic kidney disease     Comment:  renal calculi /w pregnancy No date: Complication of anesthesia     Comment:  post anesthesia - swelling of lip- relative to tape No date: Family history of adverse reaction to anesthesia     Comment:  sister- slow to wake up and " has died once "  No date: Fibromyalgia No date: Heart murmur     Comment:  rec's antibiotic prior to dental work, never had any               cardiac testing done No date: Hypertension No date: Neuromuscular disorder ([facility])     Comment:  DDD- lumbar & cervical  No date: Psoriasis No date: Seasonal allergies No date: Stroke [facility])     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, effected on  L side- some weakness remains    Reproductive/Obstetrics                             Anesthesia Physical Anesthesia Plan  ASA: III  Anesthesia Plan: General   Post-op Pain Management:    Induction: Intravenous  PONV Risk Score and Plan: 2 and Propofol infusion  Airway Management Planned: Natural Airway  Additional Equipment:   Intra-op Plan:   Post-operative Plan:   Informed Consent: I have reviewed the patients History and Physical, chart, labs and discussed the procedure including the risks, benefits and alternatives for the proposed anesthesia with the patient or authorized representative who has indicated his/her understanding and acceptance.     Dental advisory given  Plan Discussed with: CRNA and Anesthesiologist  Anesthesia Plan Comments:         Anesthesia Quick Evaluation

## 2020-05-07 NOTE — Op Note (Signed)
[facility] Patient Name: Annette Mccullough Procedure Date: 05/07/2020 9:52 AM MRN: [id number] Account #: [id number] Date of Birth: 1946-07-11 Admit Type: Outpatient Age: 74 Room: [facility] ENDO ROOM 1 Gender: Female Note Status: Finalized Procedure:             Colonoscopy Indications:           Iron deficiency anemia Providers:             Robert Bellow, MD Medicines:             Monitored Anesthesia Care Complications:         No immediate complications. Procedure:             Pre-Anesthesia Assessment:                        - Prior to the procedure, a History and Physical was                         performed, and patient medications, allergies and                         sensitivities were reviewed. The patient's tolerance                         of previous anesthesia was reviewed.                        - The risks and benefits of the procedure and the                         sedation options and risks were discussed with the                         patient. All questions were answered and informed                         consent was obtained.                        After obtaining informed consent, the colonoscope was                         passed under direct vision. Throughout the procedure,                         the patient's blood pressure, pulse, and oxygen                         saturations were monitored continuously. The                         Colonoscope was introduced through the anus and                         advanced to the the cecum, identified by appendiceal                         orifice and ileocecal valve. The colonoscopy was  somewhat difficult due to multiple diverticula in the                         colon and a tortuous colon. Successful completion of                         the procedure was aided by using manual pressure. The                         patient tolerated the procedure well.  The quality of                         the bowel preparation was good. Findings:      Multiple medium-mouthed diverticula were found in the sigmoid colon and       descending colon.      An infiltrative partially obstructing large mass was found in the distal       ascending colon. The mass was partially circumferential (involving       two-thirds of the lumen circumference). The mass measured five cm in       length. In addition, its diameter measured ten mm. No bleeding was       present. Biopsies were taken with a cold forceps for histology.      The retroflexed view of the distal rectum and anal verge was normal and       showed no anal or rectal abnormalities. Impression:            - Diverticulosis in the sigmoid colon and in the                         descending colon.                        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 Biopsied.                        - The distal rectum and anal verge are normal on                         retroflexion view. Recommendation:        - Return to endoscopist in 1 week. Procedure Code(s):     --- Professional ---                        [phone], RT, Colonoscopy, flexible; with biopsy, single                         or multiple Diagnosis Code(s):     --- Professional ---                        C18.2, Malignant neoplasm of ascending colon                        K56.690, Other partial intestinal obstruction  K57.30, Diverticulosis of large intestine without                         perforation or abscess without bleeding                        D50.9, Iron deficiency anemia, unspecified CPT copyright 2019 American Medical Association. All rights reserved. The codes documented in this report are preliminary and upon coder review may  be revised to meet current compliance requirements. Robert Bellow, MD 05/07/2020 10:51:33 AM This report has been signed  electronically. Number of Addenda: 0 Note Initiated On: 05/07/2020 9:52 AM Scope Withdrawal Time: 0 hours 8 minutes 51 seconds  Total Procedure Duration: 0 hours 29 minutes 48 seconds  Estimated Blood Loss:  Estimated blood loss: none.      [facility]

## 2020-05-08 ENCOUNTER — Other Ambulatory Visit: Payer: Self-pay | Admitting: Pathology

## 2020-05-08 LAB — SURGICAL PATHOLOGY

## 2020-05-13 ENCOUNTER — Other Ambulatory Visit: Payer: Self-pay | Admitting: General Surgery

## 2020-05-16 ENCOUNTER — Ambulatory Visit: Payer: Medicare Other

## 2020-05-16 ENCOUNTER — Encounter
Admission: RE | Admit: 2020-05-16 | Discharge: 2020-05-16 | Disposition: A | Discharge status: Home / Self Care | Payer: Medicare Other | Source: Ambulatory Visit | Attending: General Surgery | Admitting: General Surgery

## 2020-05-16 ENCOUNTER — Other Ambulatory Visit: Payer: Self-pay

## 2020-05-16 HISTORY — DX: Malignant (primary) neoplasm, unspecified: C80.1

## 2020-05-16 HISTORY — DX: Personal history of urinary calculi: Z87.442

## 2020-05-16 HISTORY — DX: Gastro-esophageal reflux disease without esophagitis: K21.9

## 2020-05-16 NOTE — Patient Instructions (Signed)
COVID TESTING Date: May 22, 2020 THURSDAY Testing site:  [facility] [address] Thru Hours:  4:08 am - 1:00 pm Once you are tested, you are asked to stay quarantined (avoiding public places) until after your surgery.   Your procedure is scheduled on: May 26, 2020 MONDAY Report to Day Surgery on the 2nd floor of the [facility]. To find out your arrival time, please call [phone] between 1PM - 3PM on: May 23, 2020 FRIDAY  REMEMBER: Instructions that are not followed completely may result in serious medical risk, up to and including death; or upon the discretion of your surgeon and anesthesiologist your surgery may need to be rescheduled.  Do not eat food after midnight the night before surgery.  No gum chewing, lozengers or hard candies.  You may however, drink CLEAR liquids up to 2 hours before you are scheduled to arrive for your surgery. Do not drink anything within 2 hours of your scheduled arrival time.  Clear liquids include: - water  - apple juice without pulp - gatorade (not RED) - black coffee or tea (Do NOT add milk or creamers to the coffee or tea) Do NOT drink anything that is not on this list.  Type 1 and Type 2 diabetics should only drink water.  ENSURE PRE-SURGERY CARBOHYDRATE DRINK:  Complete drinking 2 hours prior to scheduled arrival time.  TAKE THESE MEDICATIONS THE MORNING OF SURGERY WITH A SIP OF WATER: METOPROLOL CYMBALTA PANTOPRAZOLE  (take one the night before and one on the morning of surgery - helps to prevent nausea after surgery.)  Follow recommendations from Cardiologist, Pulmonologist or PCP regarding stopping Aspirin, Coumadin, Plavix, Eliquis, Pradaxa, or Pletal. STOP ASPIRIN 5 DAYS BEFORE SUGERY  Stop Anti-inflammatories (NSAIDS) such as Advil, Aleve, Ibuprofen, Motrin, Naproxen, Naprosyn and Aspirin based products such as Excedrin, Goodys Powder, BC Powder. (May take Tylenol or Acetaminophen if  needed.)  Stop ANY OVER THE COUNTER supplements until after surgery. (May continue Vitamin D, Vitamin B, and multivitamin FERROUS, AND  POTASSIUM)  No Alcohol for 24 hours before or after surgery.  No Smoking including e-cigarettes for 24 hours prior to surgery.  No chewable tobacco products for at least 6 hours prior to surgery.  No nicotine patches on the day of surgery.  Do not use any "recreational" drugs for at least a week prior to your surgery.  Please be advised that the combination of cocaine and anesthesia may have negative outcomes, up to and including death. If you test positive for cocaine, your surgery will be cancelled.  On the morning of surgery brush your teeth with toothpaste and water, you may rinse your mouth with mouthwash if you wish. Do not swallow any toothpaste or mouthwash.  Do not wear jewelry, make-up, hairpins, clips or nail polish.  Do not wear lotions, powders, or perfumes OR DEODORANT   Do not shave 48 hours prior to surgery.   Contact lenses, hearing aids and dentures may not be worn into surgery.  Do not bring valuables to the hospital. [facility] is not responsible for any missing/lost belongings or valuables.   Use CHG Soap as directed on instruction sheet.  Notify your doctor if there is any change in your medical condition (cold, fever, infection).  Wear comfortable clothing (specific to your surgery type) to the hospital.  Plan for stool softeners for home use; pain medications have a tendency to cause constipation. You can also help prevent constipation by eating foods high in  fiber such as fruits and vegetables and drinking plenty of fluids as your diet allows.  After surgery, you can help prevent lung complications by doing breathing exercises.  Take deep breaths and cough every 1-2 hours. Your doctor may order a device called an Incentive Spirometer to help you take deep breaths. When coughing or sneezing, hold a pillow firmly against  your incision with both hands. This is called "splinting." Doing this helps protect your incision. It also decreases belly discomfort.  If you are being admitted to the hospital overnight, leave your suitcase in the car. After surgery it may be brought to your room.  If you are being discharged the day of surgery, you will not be allowed to drive home. You will need a responsible adult (18 years or older) to drive you home and stay with you that night.   Please call the [facility] at [phone] if you have any questions about these instructions.  Visitation Policy:  Patients undergoing a surgery or procedure may have one family member or support person with them as long as that person is not COVID-19 positive or experiencing its symptoms.  That person may remain in the waiting area during the procedure.  Children under 56 years of age may have both parents or legal guardians with them during their procedure.  Inpatient Visitation Update:   In an effort to ensure the safety of our team members and our patients, we are implementing a change to our visitation policy:  Effective Monday, Aug. 9, at 7 a.m., inpatients will be allowed one support person.  o The support person may change daily.  o The support person must pass our screening, gel in and out, and wear a mask at all times, including in the patient's room.  o Patients must also wear a mask when staff or their support person are in the room.  o Masking is required regardless of vaccination status.  Systemwide, no visitors 17 or younger.

## 2020-05-20 ENCOUNTER — Ambulatory Visit
Admission: RE | Admit: 2020-05-20 | Discharge: 2020-05-20 | Disposition: A | Discharge status: Home / Self Care | Payer: Medicare Other | Source: Ambulatory Visit | Attending: General Surgery | Admitting: General Surgery

## 2020-05-20 ENCOUNTER — Other Ambulatory Visit: Payer: Self-pay

## 2020-05-20 DIAGNOSIS — C182 Malignant neoplasm of ascending colon: Secondary | ICD-10-CM | POA: Insufficient documentation

## 2020-05-20 MED ORDER — IOHEXOL 300 MG/ML  SOLN
100.0000 mL | Freq: Once | INTRAMUSCULAR | Status: AC | PRN
  Administered 2020-05-20: 100 mL via INTRAVENOUS

## 2020-05-22 ENCOUNTER — Other Ambulatory Visit: Payer: Self-pay | Admitting: General Surgery

## 2020-05-22 ENCOUNTER — Other Ambulatory Visit
Admission: RE | Admit: 2020-05-22 | Discharge: 2020-05-22 | Disposition: A | Discharge status: Home / Self Care | Payer: Medicare Other | Source: Ambulatory Visit | Attending: General Surgery | Admitting: General Surgery

## 2020-05-22 ENCOUNTER — Other Ambulatory Visit: Payer: Self-pay

## 2020-05-22 ENCOUNTER — Other Ambulatory Visit: Payer: Medicare Other

## 2020-05-22 DIAGNOSIS — Z01818 Encounter for other preprocedural examination: Secondary | ICD-10-CM | POA: Insufficient documentation

## 2020-05-22 DIAGNOSIS — Z20822 Contact with and (suspected) exposure to covid-19: Secondary | ICD-10-CM | POA: Diagnosis not present

## 2020-05-22 LAB — TYPE AND SCREEN
ABO/RH(D): A POS
Antibody Screen: NEGATIVE

## 2020-05-22 NOTE — Progress Notes (Deleted)
  The note originally documented on this encounter has been moved the the encounter in which it belongs.  

## 2020-05-22 NOTE — Progress Notes (Signed)
Tumor Board Documentation  ASAMI LAMBRIGHT was presented by Dr Bary Castilla at our Tumor Board on 05/22/2020, which included representatives from medical oncology, radiation oncology, surgical oncology, internal medicine, navigation, pathology, radiology, surgical, pharmacy, research, palliative care, pulmonology.  Jaleisa currently presents as a new patient, for [place], for new positive pathology with history of the following treatments: active survellience, surgical intervention(s).  Additionally, we reviewed previous medical and familial history, history of present illness, and recent lab results along with all available histopathologic and imaging studies. The tumor board considered available treatment options and made the following recommendations: Surgery    The following procedures/referrals were also placed: No orders of the defined types were placed in this encounter.   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  Tumor board is a meeting of clinicians from various specialty areas who evaluate and discuss patients for whom a multidisciplinary approach is being considered. Final determinations in the plan of care are those of the provider(s). The responsibility for follow up of recommendations given during tumor board is that of the provider.   Today's extended care, comprehensive team conference, Sahvanna was not present for the discussion and was not examined.   Multidisciplinary Tumor Board is a multidisciplinary case peer review process.  Decisions discussed in the Multidisciplinary Tumor Board reflect the opinions of the specialists present at the conference without having examined the patient.  Ultimately, treatment and diagnostic decisions rest with the primary provider(s) and the  patient.
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
## 2020-05-23 LAB — SARS CORONAVIRUS 2 (TAT 6-24 HRS): SARS Coronavirus 2: NEGATIVE

## 2020-05-25 MED ORDER — SODIUM CHLORIDE 0.9 % IV SOLN
1.0000 g | INTRAVENOUS | Status: AC
  Administered 2020-05-26: 1 g via INTRAVENOUS
  Filled 2020-05-25: qty 1

## 2020-05-26 ENCOUNTER — Inpatient Hospital Stay: Payer: Medicare Other | Admitting: Anesthesiology

## 2020-05-26 ENCOUNTER — Encounter
Admission: RE | Disposition: A | Discharge status: Home / Self Care | Payer: Self-pay | Source: Ambulatory Visit | Attending: General Surgery

## 2020-05-26 ENCOUNTER — Encounter: Payer: Self-pay | Admitting: General Surgery

## 2020-05-26 ENCOUNTER — Inpatient Hospital Stay
Admission: RE | Admit: 2020-05-26 | Discharge: 2020-05-28 | DRG: 331 | Disposition: A | Discharge status: Home / Self Care | Payer: Medicare Other | Source: Ambulatory Visit | Attending: General Surgery | Admitting: General Surgery

## 2020-05-26 ENCOUNTER — Other Ambulatory Visit: Payer: Self-pay

## 2020-05-26 DIAGNOSIS — Z7982 Long term (current) use of aspirin: Secondary | ICD-10-CM | POA: Diagnosis not present

## 2020-05-26 DIAGNOSIS — C182 Malignant neoplasm of ascending colon: Secondary | ICD-10-CM | POA: Diagnosis present

## 2020-05-26 DIAGNOSIS — Z808 Family history of malignant neoplasm of other organs or systems: Secondary | ICD-10-CM

## 2020-05-26 DIAGNOSIS — Z803 Family history of malignant neoplasm of breast: Secondary | ICD-10-CM

## 2020-05-26 DIAGNOSIS — Z801 Family history of malignant neoplasm of trachea, bronchus and lung: Secondary | ICD-10-CM | POA: Diagnosis not present

## 2020-05-26 DIAGNOSIS — Z87891 Personal history of nicotine dependence: Secondary | ICD-10-CM

## 2020-05-26 DIAGNOSIS — Z8673 Personal history of transient ischemic attack (TIA), and cerebral infarction without residual deficits: Secondary | ICD-10-CM | POA: Diagnosis not present

## 2020-05-26 DIAGNOSIS — Z90710 Acquired absence of both cervix and uterus: Secondary | ICD-10-CM

## 2020-05-26 DIAGNOSIS — Z79899 Other long term (current) drug therapy: Secondary | ICD-10-CM

## 2020-05-26 DIAGNOSIS — Z91048 Other nonmedicinal substance allergy status: Secondary | ICD-10-CM | POA: Diagnosis not present

## 2020-05-26 DIAGNOSIS — Z96651 Presence of right artificial knee joint: Secondary | ICD-10-CM | POA: Diagnosis present

## 2020-05-26 DIAGNOSIS — Z91012 Allergy to eggs: Secondary | ICD-10-CM

## 2020-05-26 HISTORY — PX: LAPAROSCOPIC RIGHT COLECTOMY: SHX5925

## 2020-05-26 LAB — BASIC METABOLIC PANEL
Anion gap: 14 (ref 5–15)
BUN: 18 mg/dL (ref 8–23)
CO2: 23 mmol/L (ref 22–32)
Calcium: 9.2 mg/dL (ref 8.9–10.3)
Chloride: 100 mmol/L (ref 98–111)
Creatinine, Ser: 0.92 mg/dL (ref 0.44–1.00)
GFR calc Af Amer: >60 mL/min (ref >60)
GFR calc non Af Amer: >60 mL/min (ref >60)
Glucose, Bld: 173 mg/dL — ABNORMAL HIGH (ref 70–99)
Potassium: 3.8 mmol/L (ref 3.5–5.1)
Sodium: 137 mmol/L (ref 135–145)

## 2020-05-26 SURGERY — COLECTOMY, RIGHT, LAPAROSCOPIC
Anesthesia: General | Laterality: Right

## 2020-05-26 MED ORDER — SUGAMMADEX SODIUM 200 MG/2ML IV SOLN
INTRAVENOUS | Status: DC | PRN
  Administered 2020-05-26: 232 mg via INTRAVENOUS

## 2020-05-26 MED ORDER — FENTANYL CITRATE (PF) 100 MCG/2ML IJ SOLN
25.0000 ug | INTRAMUSCULAR | Status: DC | PRN
  Administered 2020-05-26 (×2): 50 ug via INTRAVENOUS

## 2020-05-26 MED ORDER — MORPHINE SULFATE (PF) 4 MG/ML IV SOLN
INTRAVENOUS | Status: AC
  Filled 2020-05-26: qty 1

## 2020-05-26 MED ORDER — FENTANYL CITRATE (PF) 100 MCG/2ML IJ SOLN
INTRAMUSCULAR | Status: AC
Start: 2020-05-26 — End: 2020-05-27
  Filled 2020-05-26: qty 2

## 2020-05-26 MED ORDER — KETAMINE HCL 50 MG/ML IJ SOLN
INTRAMUSCULAR | Status: AC
  Filled 2020-05-26: qty 10

## 2020-05-26 MED ORDER — PROPOFOL 10 MG/ML IV BOLUS
INTRAVENOUS | Status: AC
  Filled 2020-05-26: qty 20

## 2020-05-26 MED ORDER — METOPROLOL TARTRATE 25 MG PO TABS: 25.0000 mg | ORAL_TABLET | Freq: Every day | ORAL | Status: DC

## 2020-05-26 MED ORDER — ACETAMINOPHEN 10 MG/ML IV SOLN
INTRAVENOUS | Status: AC
  Filled 2020-05-26: qty 100

## 2020-05-26 MED ORDER — SUCCINYLCHOLINE CHLORIDE 20 MG/ML IJ SOLN
INTRAMUSCULAR | Status: DC | PRN
  Administered 2020-05-26: 120 mg via INTRAVENOUS

## 2020-05-26 MED ORDER — CHLORHEXIDINE GLUCONATE 0.12 % MT SOLN
OROMUCOSAL | Status: AC
  Administered 2020-05-26: 15 mL via OROMUCOSAL
  Filled 2020-05-26: qty 15

## 2020-05-26 MED ORDER — LACTATED RINGERS IV SOLN: INTRAVENOUS | Status: DC

## 2020-05-26 MED ORDER — ACETAMINOPHEN 10 MG/ML IV SOLN
1000.0000 mg | Freq: Four times a day (QID) | INTRAVENOUS | Status: AC
  Administered 2020-05-27: 1000 mg via INTRAVENOUS

## 2020-05-26 MED ORDER — ENOXAPARIN SODIUM 40 MG/0.4ML ~~LOC~~ SOLN: 40.0000 mg | SUBCUTANEOUS | Status: DC

## 2020-05-26 MED ORDER — PROPOFOL 10 MG/ML IV BOLUS
INTRAVENOUS | Status: DC | PRN
  Administered 2020-05-26: 200 mg via INTRAVENOUS

## 2020-05-26 MED ORDER — OXYCODONE HCL 5 MG PO TABS: 5.0000 mg | ORAL_TABLET | ORAL | Status: DC | PRN

## 2020-05-26 MED ORDER — ACETAMINOPHEN 10 MG/ML IV SOLN
INTRAVENOUS | Status: DC | PRN
  Administered 2020-05-26: 1000 mg via INTRAVENOUS

## 2020-05-26 MED ORDER — FENTANYL CITRATE (PF) 100 MCG/2ML IJ SOLN
INTRAMUSCULAR | Status: AC
  Filled 2020-05-26: qty 2

## 2020-05-26 MED ORDER — PHENYLEPHRINE HCL (PRESSORS) 10 MG/ML IV SOLN
INTRAVENOUS | Status: DC | PRN
  Administered 2020-05-26: 100 ug via INTRAVENOUS

## 2020-05-26 MED ORDER — DEXAMETHASONE SODIUM PHOSPHATE 10 MG/ML IJ SOLN
INTRAMUSCULAR | Status: DC | PRN
  Administered 2020-05-26: 10 mg via INTRAVENOUS

## 2020-05-26 MED ORDER — DIPHENHYDRAMINE HCL 25 MG PO TABS: 25.0000 mg | ORAL_TABLET | Freq: Every day | ORAL | Status: DC | PRN

## 2020-05-26 MED ORDER — ONDANSETRON HCL 4 MG/2ML IJ SOLN
INTRAMUSCULAR | Status: AC
  Filled 2020-05-26: qty 2

## 2020-05-26 MED ORDER — MORPHINE SULFATE (PF) 4 MG/ML IV SOLN
2.0000 mg | INTRAVENOUS | Status: DC | PRN
  Administered 2020-05-26 – 2020-05-27 (×2): 2 mg via INTRAVENOUS

## 2020-05-26 MED ORDER — PROMETHAZINE HCL 25 MG/ML IJ SOLN: 6.2500 mg | INTRAMUSCULAR | Status: DC | PRN

## 2020-05-26 MED ORDER — DIPHENHYDRAMINE HCL 25 MG PO CAPS: 50.0000 mg | ORAL_CAPSULE | Freq: Every evening | ORAL | Status: DC | PRN

## 2020-05-26 MED ORDER — OXYCODONE HCL 5 MG PO TABS
ORAL_TABLET | ORAL | Status: AC
  Administered 2020-05-26: 10 mg via ORAL
  Filled 2020-05-26: qty 2

## 2020-05-26 MED ORDER — ACETAMINOPHEN 10 MG/ML IV SOLN
INTRAVENOUS | Status: AC
  Administered 2020-05-26: 1000 mg via INTRAVENOUS
  Filled 2020-05-26: qty 100

## 2020-05-26 MED ORDER — ORAL CARE MOUTH RINSE: 15.0000 mL | Freq: Once | OROMUCOSAL | Status: AC

## 2020-05-26 MED ORDER — ASPIRIN EC 81 MG PO TBEC
81.0000 mg | DELAYED_RELEASE_TABLET | Freq: Every day | ORAL | Status: DC
  Administered 2020-05-26 – 2020-05-28 (×3): 81 mg via ORAL
  Filled 2020-05-26 (×2): qty 1

## 2020-05-26 MED ORDER — MORPHINE SULFATE (PF) 2 MG/ML IV SOLN
INTRAVENOUS | Status: AC
  Filled 2020-05-26: qty 1

## 2020-05-26 MED ORDER — CYCLOBENZAPRINE HCL 10 MG PO TABS
10.0000 mg | ORAL_TABLET | Freq: Three times a day (TID) | ORAL | Status: DC | PRN
  Administered 2020-05-26 – 2020-05-28 (×3): 10 mg via ORAL
  Filled 2020-05-26 (×4): qty 1

## 2020-05-26 MED ORDER — BUPIVACAINE-EPINEPHRINE (PF) 0.5% -1:200000 IJ SOLN
INTRAMUSCULAR | Status: AC
  Filled 2020-05-26: qty 30

## 2020-05-26 MED ORDER — LISINOPRIL 20 MG PO TABS
20.0000 mg | ORAL_TABLET | Freq: Two times a day (BID) | ORAL | Status: DC
  Administered 2020-05-26 – 2020-05-27 (×3): 20 mg via ORAL
  Filled 2020-05-26 (×5): qty 1

## 2020-05-26 MED ORDER — OXYCODONE HCL 5 MG PO TABS
10.0000 mg | ORAL_TABLET | Freq: Four times a day (QID) | ORAL | Status: DC
  Administered 2020-05-27: 10 mg via ORAL

## 2020-05-26 MED ORDER — ROCURONIUM BROMIDE 100 MG/10ML IV SOLN
INTRAVENOUS | Status: DC | PRN
  Administered 2020-05-26: 40 mg via INTRAVENOUS
  Administered 2020-05-26: 20 mg via INTRAVENOUS
  Administered 2020-05-26 (×2): 10 mg via INTRAVENOUS

## 2020-05-26 MED ORDER — SUCCINYLCHOLINE CHLORIDE 200 MG/10ML IV SOSY
PREFILLED_SYRINGE | INTRAVENOUS | Status: AC
  Filled 2020-05-26: qty 10

## 2020-05-26 MED ORDER — DEXAMETHASONE SODIUM PHOSPHATE 10 MG/ML IJ SOLN
INTRAMUSCULAR | Status: AC
  Filled 2020-05-26: qty 1

## 2020-05-26 MED ORDER — FENTANYL CITRATE (PF) 100 MCG/2ML IJ SOLN
INTRAMUSCULAR | Status: DC | PRN
Start: 2020-05-26 — End: 2020-05-26
  Administered 2020-05-26 (×4): 50 ug via INTRAVENOUS

## 2020-05-26 MED ORDER — ROCURONIUM BROMIDE 10 MG/ML (PF) SYRINGE
PREFILLED_SYRINGE | INTRAVENOUS | Status: AC
  Filled 2020-05-26: qty 10

## 2020-05-26 MED ORDER — DEXMEDETOMIDINE HCL 200 MCG/2ML IV SOLN
INTRAVENOUS | Status: DC | PRN
  Administered 2020-05-26: 20 ug via INTRAVENOUS

## 2020-05-26 MED ORDER — HYDROMORPHONE HCL 1 MG/ML IJ SOLN
INTRAMUSCULAR | Status: DC | PRN
  Administered 2020-05-26 (×2): .5 mg via INTRAVENOUS

## 2020-05-26 MED ORDER — LIDOCAINE HCL (CARDIAC) PF 100 MG/5ML IV SOSY
PREFILLED_SYRINGE | INTRAVENOUS | Status: DC | PRN
  Administered 2020-05-26: 100 mg via INTRAVENOUS

## 2020-05-26 MED ORDER — ONDANSETRON 4 MG PO TBDP: 4.0000 mg | ORAL_TABLET | ORAL | Status: DC | PRN

## 2020-05-26 MED ORDER — DULOXETINE HCL 60 MG PO CPEP
60.0000 mg | ORAL_CAPSULE | Freq: Every day | ORAL | Status: DC
  Administered 2020-05-27 – 2020-05-28 (×2): 60 mg via ORAL
  Filled 2020-05-26 (×2): qty 1

## 2020-05-26 MED ORDER — LIDOCAINE HCL (PF) 2 % IJ SOLN
INTRAMUSCULAR | Status: AC
  Filled 2020-05-26: qty 5

## 2020-05-26 MED ORDER — BUPIVACAINE-EPINEPHRINE (PF) 0.5% -1:200000 IJ SOLN
INTRAMUSCULAR | Status: DC | PRN
  Administered 2020-05-26: 30 mL

## 2020-05-26 MED ORDER — HYPROMELLOSE (GONIOSCOPIC) 2.5 % OP SOLN: 2.0000 [drp] | Freq: Four times a day (QID) | OPHTHALMIC | Status: DC | PRN

## 2020-05-26 MED ORDER — KETAMINE HCL 50 MG/ML IJ SOLN
INTRAMUSCULAR | Status: DC | PRN
  Administered 2020-05-26: 50 mg via INTRAMUSCULAR

## 2020-05-26 MED ORDER — CHLORHEXIDINE GLUCONATE 0.12 % MT SOLN: 15.0000 mL | Freq: Once | OROMUCOSAL | Status: AC

## 2020-05-26 MED ORDER — PANTOPRAZOLE SODIUM 40 MG PO TBEC
40.0000 mg | DELAYED_RELEASE_TABLET | Freq: Every day | ORAL | Status: DC
  Administered 2020-05-27 – 2020-05-28 (×2): 40 mg via ORAL
  Filled 2020-05-26 (×3): qty 1

## 2020-05-26 MED ORDER — ENSURE PRE-SURGERY PO LIQD
296.0000 mL | Freq: Once | ORAL | Status: DC
  Filled 2020-05-26: qty 296

## 2020-05-26 MED ORDER — IBUPROFEN 800 MG PO TABS: 400.0000 mg | ORAL_TABLET | ORAL | Status: DC | PRN

## 2020-05-26 MED ORDER — ONDANSETRON HCL 4 MG/2ML IJ SOLN
4.0000 mg | Freq: Once | INTRAMUSCULAR | Status: AC | PRN
  Administered 2020-05-26: 4 mg via INTRAVENOUS

## 2020-05-26 MED ORDER — HYDROMORPHONE HCL 1 MG/ML IJ SOLN
INTRAMUSCULAR | Status: AC
  Filled 2020-05-26: qty 1

## 2020-05-26 SURGICAL SUPPLY — 75 items
"PENCIL ELECTRO HAND CTR " (MISCELLANEOUS) ×1 IMPLANT
APPLIER CLIP ROT 10 11.4 M/L (STAPLE)
BLADE SURG 10 STRL SS SAFETY (BLADE) ×3 IMPLANT
BLADE SURG 11 STRL SS SAFETY (MISCELLANEOUS) ×3 IMPLANT
CANISTER SUCT 1200ML W/VALVE (MISCELLANEOUS) ×3 IMPLANT
CANNULA DILATOR 10 W/SLV (CANNULA) ×2 IMPLANT
CANNULA DILATOR 10MM W/SLV (CANNULA) ×1
CHLORAPREP W/TINT 26 (MISCELLANEOUS) ×3 IMPLANT
CLIP APPLIE ROT 10 11.4 M/L (STAPLE) IMPLANT
CLOSURE WOUND 1/2 X4 (GAUZE/BANDAGES/DRESSINGS) ×1
COVER CLAMP SIL LG PBX B (MISCELLANEOUS) IMPLANT
COVER WAND RF STERILE (DRAPES) ×3 IMPLANT
DRAPE LAP W/FLUID (DRAPES) IMPLANT
DRAPE UNDER BUTTOCK W/FLU (DRAPES) IMPLANT
DRSG OPSITE POSTOP 4X10 (GAUZE/BANDAGES/DRESSINGS) ×1 IMPLANT
DRSG OPSITE POSTOP 4X8 (GAUZE/BANDAGES/DRESSINGS) ×1 IMPLANT
DRSG TEGADERM 2-3/8X2-3/4 SM (GAUZE/BANDAGES/DRESSINGS) ×9 IMPLANT
DRSG TEGADERM 4X4.75 (GAUZE/BANDAGES/DRESSINGS) ×1 IMPLANT
DRSG TELFA 3X8 NADH (GAUZE/BANDAGES/DRESSINGS) IMPLANT
ELECT BLADE 6.5 EXT (BLADE) ×3 IMPLANT
ELECT CAUTERY BLADE 6.4 (BLADE) ×3 IMPLANT
ELECT REM PT RETURN 9FT ADLT (ELECTROSURGICAL) ×3
ELECTRODE REM PT RTRN 9FT ADLT (ELECTROSURGICAL) ×1 IMPLANT
GLOVE BIO SURGEON STRL SZ7.5 (GLOVE) ×13 IMPLANT
GLOVE INDICATOR 8.0 STRL GRN (GLOVE) ×12 IMPLANT
GOWN STRL REUS W/ TWL LRG LVL3 (GOWN DISPOSABLE) ×6 IMPLANT
GOWN STRL REUS W/TWL LRG LVL3 (GOWN DISPOSABLE) ×12
HANDLE YANKAUER SUCT BULB TIP (MISCELLANEOUS) ×3 IMPLANT
HOLDER FOLEY CATH W/STRAP (MISCELLANEOUS) ×3 IMPLANT
IRRIGATION STRYKERFLOW (MISCELLANEOUS) IMPLANT
IRRIGATOR STRYKERFLOW (MISCELLANEOUS)
IV LACTATED RINGERS 1000ML (IV SOLUTION) ×3 IMPLANT
KIT PINK PAD W/HEAD ARE REST (MISCELLANEOUS) ×3
KIT PINK PAD W/HEAD ARM REST (MISCELLANEOUS) ×1 IMPLANT
KIT TURNOVER KIT A (KITS) ×3 IMPLANT
LABEL OR SOLS (LABEL) ×3 IMPLANT
LIGASURE LAP MARYLAND 5MM 37CM (ELECTROSURGICAL) ×2 IMPLANT
NDL INSUFF ACCESS 14 VERSASTEP (NEEDLE) ×3 IMPLANT
NEEDLE HYPO 22GX1.5 SAFETY (NEEDLE) ×3 IMPLANT
NS IRRIG 500ML POUR BTL (IV SOLUTION) ×3 IMPLANT
PACK COLON CLEAN CLOSURE (MISCELLANEOUS) ×3 IMPLANT
PACK LAP CHOLECYSTECTOMY (MISCELLANEOUS) ×3 IMPLANT
PAD DRESSING TELFA 3X8 NADH (GAUZE/BANDAGES/DRESSINGS) ×1 IMPLANT
PAD PREP 24X41 OB/GYN DISP (PERSONAL CARE ITEMS) ×3 IMPLANT
PENCIL ELECTRO HAND CTR (MISCELLANEOUS) ×3 IMPLANT
RELOAD PROXIMATE 75MM BLUE (ENDOMECHANICALS) ×6 IMPLANT
RELOAD STAPLE 75 3.8 BLU REG (ENDOMECHANICALS) IMPLANT
RETRACTOR WOUND ALXS 18CM MED (MISCELLANEOUS) IMPLANT
RTRCTR WOUND ALEXIS O 18CM MED (MISCELLANEOUS) ×3
SCISSORS METZENBAUM CVD 33 (INSTRUMENTS) ×3 IMPLANT
SET YANKAUER POOLE SUCT (MISCELLANEOUS) ×3 IMPLANT
SLEEVE ENDOPATH XCEL 5M (ENDOMECHANICALS) ×3 IMPLANT
SPONGE LAP 18X18 RF (DISPOSABLE) ×3 IMPLANT
STAPLER GUN LINEAR PROX 60 (STAPLE) ×2 IMPLANT
STAPLER PROXIMATE 75MM BLUE (STAPLE) ×2 IMPLANT
STRIP CLOSURE SKIN 1/2X4 (GAUZE/BANDAGES/DRESSINGS) ×2 IMPLANT
SUT MAXON ABS #0 GS21 30IN (SUTURE) ×8 IMPLANT
SUT SILK 2 0 (SUTURE) ×4
SUT SILK 2-0 30XBRD TIE 12 (SUTURE) ×1 IMPLANT
SUT SILK 3-0 (SUTURE) ×2 IMPLANT
SUT VIC AB 0 CT1 36 (SUTURE) ×3 IMPLANT
SUT VIC AB 2-0 BRD 54 (SUTURE) ×8 IMPLANT
SUT VIC AB 2-0 CT1 27 (SUTURE) ×6
SUT VIC AB 2-0 CT1 TAPERPNT 27 (SUTURE) ×2 IMPLANT
SUT VIC AB 3-0 54X BRD REEL (SUTURE) ×2 IMPLANT
SUT VIC AB 3-0 BRD 54 (SUTURE) ×4
SUT VIC AB 3-0 SH 27 (SUTURE)
SUT VIC AB 3-0 SH 27X BRD (SUTURE) ×2 IMPLANT
SUT VIC AB 4-0 FS2 27 (SUTURE) ×2 IMPLANT
SWABSTK COMLB BENZOIN TINCTURE (MISCELLANEOUS) ×5 IMPLANT
TRAY FOLEY MTR SLVR 16FR STAT (SET/KITS/TRAYS/PACK) ×3 IMPLANT
TROCAR XCEL BLUNT TIP 100MML (ENDOMECHANICALS) ×2 IMPLANT
TROCAR XCEL NON-BLD 11X100MML (ENDOMECHANICALS) ×2 IMPLANT
TROCAR XCEL NON-BLD 5MMX100MML (ENDOMECHANICALS) ×3 IMPLANT
TUBING EVAC SMOKE HEATED PNEUM (TUBING) ×3 IMPLANT

## 2020-05-26 NOTE — Op Note (Signed)
Preoperative diagnosis: Right colon cancer.  Postoperative diagnosis: Same.  Operative procedure: Laparoscopically assisted right hemicolectomy with ileotransverse colostomy.  Operating surgeon: Hervey Ard, MD.  Assistant: Arvilla Meres, RNFA.  Anesthesia: General endotracheal; Marcaine 0.5% with 1: 200,000 notes of epinephrine, 30 cc.  Estimated blood loss: 50 cc  Fluid replacement: 700cc crystalloid.  Clinical note: This 74 year old woman was noted to be anemic.  She underwent upper and lower endoscopy with identification of a large tumor of the ascending colon.  She is admitted at this time for planned right hemicolectomy.  Preoperative CT did not show evidence of metastatic disease.  The patient had SCD stockings for DVT prevention.  She received Invanz 1 g intravenously on induction of anesthesia.  The patient did receive a formal bowel prep with oral antibiotics.  The patient was placed on a pink man for a firm grounding and padded appropriately.  Left arm was tucked.   Operative note: The patient underwent general anesthesia without difficulty.  A Foley catheter was placed by the RNFA.  Clear urine was noted.  The abdomen was cleansed with ChloraPrep and draped.  Based on her multiple previous surgeries it was elected to enter the abdomen making use of a open technique.  The midline was opened just above the umbilicus and the fascia exposed.  The peritoneum was grasped and opened carefully.  No evidence of adherent bowel.  A 0 Maxon pursestring suture was placed followed by the cannula.  The abdomen was insufflated to 10 mmHg pressure with CO2.  No evidence of peritoneal disease or free fluid.  A 10 mm Port was placed in the epigastrium just to the right of the falciform ligament and an 11 mm XL port was placed in left upper quadrant under direct vision.  The omentum was freed from the right half of the transverse colon making use of the LigaSure device.  This was swept around the  hepatic flexure and then continued down the white line of Toldt.  The colon was rolled medially.  The tumor appeared to be in the midportion of the ascending colon.  Previously placed ink was identified.  The duodenum was exposed and protected.  When the entire right colon and terminal ileum could reach into the left upper quadrant this portion of the procedure was terminated after placing a moist lap in the right upper quadrant.    An 8 cm midline incision beginning above the umbilicus was made.  This was completed after instillation of local anesthetic.  The skin was incised sharply and remaining dissection with electrocautery.  A medium Alexis wound protector was placed.  The right colon was easily delivered into the abdominal cavity.  The mesentery of the transverse colon was freed with the harmonic scalpel.  The site of planned transection in the midportion of the transverse colon and an area approximately 7 cm proximal to the ileocecal valve were cleared.  Each were divided with a single application of the GIA stapler.  The right branch of the middle colic vessel was doubly ligated with 2-0 silk.  The mesentery was divided sequentially with harmonic scalpel.  The right colon vessel was doubly ligated with 2-0 silk.  The specimen was handed off.  A side-to-side functional end-to-end anastomosis was completed using 3-0 silk seromuscular sutures to align the bowel.  Enterotomies were made in each lumen and a GIA stapler placed.  Good hemostasis was noted.  The final anastomotic closure was with a TA 60 with 3.5 mm staples.  The  suture line was reinforced with interrupted 3-0 Vicryl figure-of-eight sutures in a seromuscular fashion.  The mesentery was closed with a running 3-0 Vicryl suture.  The previously placed lap in the right upper quadrant was removed and good hemostasis appreciated.  The abdomen was irrigated with warm saline.  The omentum was pulled down over the wound and the clean closure tray  open.  Surgeon and staff clowning gloves were changed.  The peritoneum was closed with a running 0 Vicryl suture.  The midline fascia was closed with interrupted 0 Maxon figure-of-eight sutures.  The adipose layer was closed with a running 2-0 Vicryl suture.  The skin was closed with a running 4-0 Vicryl subcuticular suture.  Benzoin and Steri-Strips followed by a honeycomb dressing to the main incision was applied.  Benzoin Steri-Strips followed by Telfa and Tegaderm dressing to the port sites.  The Foley catheter was removed at the termination of the procedure.  Foley catheter was removed and the patient taken to recovery room in stable condition.

## 2020-05-26 NOTE — H&P (Signed)
No interval change in history or clinical exam.  For right colectomy.

## 2020-05-26 NOTE — Anesthesia Preprocedure Evaluation (Signed)
Anesthesia Evaluation  Patient identified by MRN, date of birth, ID band Patient awake    Reviewed: Allergy & Precautions, NPO status , Patient's Chart, lab work & pertinent test results  History of Anesthesia Complications (+) Family history of anesthesia reaction and history of anesthetic complications  Airway Mallampati: III  TM Distance: >3 FB Neck ROM: Full    Dental  (+) Upper Dentures, Missing, Dental Advidsory Given   Pulmonary neg shortness of breath, neg sleep apnea, neg COPD, neg recent URI, former smoker,    breath sounds clear to auscultation- rhonchi (-) wheezing      Cardiovascular hypertension, Pt. on medications (-) angina(-) CAD, (-) Past MI, (-) Cardiac Stents and (-) CABG (-) dysrhythmias + Valvular Problems/Murmurs  Rhythm:Regular Rate:Normal - Systolic murmurs and - Diastolic murmurs    Neuro/Psych  Neuromuscular disease CVA (L sided weakness), Residual Symptoms negative psych ROS   GI/Hepatic Neg liver ROS, GERD  ,  Endo/Other  neg diabetesMorbid obesity  Renal/GU Renal disease (hx of nephrolithiasis)     Musculoskeletal  (+) Arthritis , Fibromyalgia -  Abdominal (+) + obese,   Peds  Hematology negative hematology ROS (+)   Anesthesia Other Findings Past Medical History: No date: Arthritis     Comment:  multiple areas No date: Blood transfusion     Comment:  70- post childbirth & /w joint replacement  No date: Chronic kidney disease     Comment:  renal calculi /w pregnancy No date: Complication of anesthesia     Comment:  post anesthesia - swelling of lip- relative to tape No date: Family history of adverse reaction to anesthesia     Comment:  sister- slow to wake up and " has died once "  No date: Fibromyalgia No date: Heart murmur     Comment:  rec's antibiotic prior to dental work, never had any               cardiac testing done No date: Hypertension No date: Neuromuscular disorder  ([place])     Comment:  DDD- lumbar & cervical  No date: Psoriasis No date: Seasonal allergies No date: Stroke [facility])     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, 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- some weakness remains    Reproductive/Obstetrics                             Anesthesia Physical  Anesthesia Plan  ASA: III  Anesthesia Plan: General   Post-op Pain Management:    Induction: Intravenous  PONV Risk Score and Plan: 2 and Ondansetron, Dexamethasone and Treatment may vary due to age or medical condition  Airway Management Planned: Oral ETT  Additional Equipment:   Intra-op Plan:   Post-operative Plan: Extubation in OR  Informed Consent: I have reviewed the patients History and Physical, chart, labs and discussed the procedure including the risks, benefits and alternatives for the proposed anesthesia with the patient or authorized representative who has indicated his/her understanding and acceptance.     Dental advisory given  Plan Discussed with: CRNA and Anesthesiologist  Anesthesia Plan Comments:         Anesthesia Quick Evaluation

## 2020-05-26 NOTE — Anesthesia Postprocedure Evaluation (Signed)
Anesthesia Post Note  Patient: Annette Mccullough  Procedure(s) Performed: LAPAROSCOPIC RIGHT HEMI-COLECTOMY (Right )  Patient location during evaluation: PACU Anesthesia Type: General Level of consciousness: awake and alert Pain management: pain level controlled Vital Signs Assessment: post-procedure vital signs reviewed and stable Respiratory status: spontaneous breathing, nonlabored ventilation, respiratory function stable and patient connected to nasal cannula oxygen Cardiovascular status: blood pressure returned to baseline and stable Postop Assessment: no apparent nausea or vomiting Anesthetic complications: no   No complications documented.   Last Vitals:  Vitals:   05/26/20 1827 05/26/20 1842  BP: (!) 151/70 (!) 139/59  Pulse: 73 73  Resp: 10 14  Temp: (!) 36.3 C   SpO2: 100% 100%    Last Pain:  Vitals:   05/26/20 2018  TempSrc:   PainSc: 9                  Arita Miss

## 2020-05-26 NOTE — Anesthesia Procedure Notes (Signed)
Procedure Name: Intubation Date/Time: 05/26/2020 1:58 PM Performed by: Eben Burow, CRNA Pre-anesthesia Checklist: Patient identified, Emergency Drugs available, Suction available and Patient being monitored Patient Re-evaluated:Patient Re-evaluated prior to induction Oxygen Delivery Method: Circle system utilized Preoxygenation: Pre-oxygenation with 100% oxygen Induction Type: IV induction Ventilation: Mask ventilation without difficulty Laryngoscope Size: McGraph and 3 Grade View: Grade I Tube type: Oral Tube size: 7.0 mm Number of attempts: 1 Airway Equipment and Method: Stylet and Video-laryngoscopy Placement Confirmation: ETT inserted through vocal cords under direct vision,  positive ETCO2 and breath sounds checked- equal and bilateral Secured at: 20 cm Tube secured with: Tape Dental Injury: Teeth and Oropharynx as per pre-operative assessment

## 2020-05-26 NOTE — Transfer of Care (Signed)
Immediate Anesthesia Transfer of Care Note  Patient: Annette Mccullough  Procedure(s) Performed: LAPAROSCOPIC RIGHT HEMI-COLECTOMY (Right )  Patient Location: PACU  Anesthesia Type:General  Level of Consciousness: sedated  Airway & Oxygen Therapy: Patient Spontanous Breathing and Patient connected to face mask oxygen  Post-op Assessment: Report given to RN and Post -op Vital signs reviewed and stable  Post vital signs: Reviewed and stable  Last Vitals:  Vitals Value Taken Time  BP 137/59 05/26/20 1641  Temp    Pulse 73 05/26/20 1642  Resp 7 05/26/20 1642  SpO2 100 % 05/26/20 1642  Vitals shown include unvalidated device data.  Last Pain:  Vitals:   05/26/20 1245  TempSrc: Temporal  PainSc: 7          Complications: No complications documented.

## 2020-05-27 ENCOUNTER — Encounter: Payer: Self-pay | Admitting: General Surgery

## 2020-05-27 LAB — GLUCOSE, CAPILLARY
Glucose-Capillary: 117 mg/dL — ABNORMAL HIGH (ref 70–99)
Glucose-Capillary: 109 mg/dL — ABNORMAL HIGH (ref 70–99)
Glucose-Capillary: 110 mg/dL — ABNORMAL HIGH (ref 70–99)

## 2020-05-27 MED ORDER — INSULIN ASPART 100 UNIT/ML ~~LOC~~ SOLN: 0.0000 [IU] | Freq: Three times a day (TID) | SUBCUTANEOUS | Status: DC

## 2020-05-27 MED ORDER — METOPROLOL TARTRATE 25 MG PO TABS
ORAL_TABLET | ORAL | Status: AC
  Administered 2020-05-27: 25 mg via ORAL
  Filled 2020-05-27: qty 1

## 2020-05-27 MED ORDER — OXYCODONE HCL 5 MG PO TABS
ORAL_TABLET | ORAL | Status: AC
  Filled 2020-05-27: qty 2

## 2020-05-27 MED ORDER — ACETAMINOPHEN 10 MG/ML IV SOLN
INTRAVENOUS | Status: AC
  Filled 2020-05-27: qty 100

## 2020-05-27 MED ORDER — OXYCODONE HCL 5 MG PO TABS
ORAL_TABLET | ORAL | Status: AC
  Administered 2020-05-27: 10 mg via ORAL
  Filled 2020-05-27: qty 2

## 2020-05-27 MED ORDER — ACETAMINOPHEN 325 MG PO TABS: 650.0000 mg | ORAL_TABLET | ORAL | Status: DC

## 2020-05-27 MED ORDER — ACETAMINOPHEN 10 MG/ML IV SOLN
INTRAVENOUS | Status: AC
  Administered 2020-05-27: 1000 mg via INTRAVENOUS
  Filled 2020-05-27: qty 100

## 2020-05-27 MED ORDER — ENOXAPARIN SODIUM 40 MG/0.4ML ~~LOC~~ SOLN
SUBCUTANEOUS | Status: AC
  Administered 2020-05-27: 40 mg via SUBCUTANEOUS
  Filled 2020-05-27: qty 0.4

## 2020-05-27 MED ORDER — OXYCODONE HCL 5 MG PO TABS
ORAL_TABLET | ORAL | Status: AC
  Administered 2020-05-27: 5 mg via ORAL
  Filled 2020-05-27: qty 1

## 2020-05-27 MED ORDER — MORPHINE SULFATE (PF) 2 MG/ML IV SOLN
INTRAVENOUS | Status: AC
  Filled 2020-05-27: qty 1

## 2020-05-27 MED ORDER — INSULIN ASPART 100 UNIT/ML ~~LOC~~ SOLN: 0.0000 [IU] | Freq: Every day | SUBCUTANEOUS | Status: DC

## 2020-05-27 MED ORDER — ACETAMINOPHEN 325 MG PO TABS
ORAL_TABLET | ORAL | Status: AC
  Administered 2020-05-27: 650 mg via ORAL
  Filled 2020-05-27: qty 2

## 2020-05-27 MED ORDER — ACETAMINOPHEN 10 MG/ML IV SOLN
INTRAVENOUS | Status: AC
  Administered 2020-05-27: 1000 mg
  Filled 2020-05-27: qty 100

## 2020-05-27 NOTE — Progress Notes (Signed)
Afebrile, VSS. Good urine output. Ambulated to restroom across the hall x 2. Using Inspirex at >1000. Back pain (chronic) > incisional pain. On baseline meds. Lungs: Clear. Cardio: RR. ABD: Non-distended, soft. Incision: Scant old blood. Extrem: Soft. Labs obtained by pharmacy last night reviewed. Elevated BS, minimal elevation on review of past labs. Possibly stress induced, but will cover while in hospital.  Will transition to po tylenol, continue baseline meds. K+: 3.8 after supplements preop.   IMP: Doing well. Plan: Ambulate as possible in the hall. (Patient boarding in outpatient surgery area.)

## 2020-05-28 MED ORDER — METOPROLOL TARTRATE 25 MG PO TABS
ORAL_TABLET | ORAL | Status: AC
  Administered 2020-05-28: 25 mg via ORAL
  Filled 2020-05-28: qty 1

## 2020-05-28 MED ORDER — LISINOPRIL 20 MG PO TABS: 20.0000 mg | ORAL_TABLET | Freq: Every day | ORAL | 1 refills | Status: AC

## 2020-05-28 MED ORDER — ACETAMINOPHEN 325 MG PO TABS
ORAL_TABLET | ORAL | Status: AC
  Administered 2020-05-28: 650 mg via ORAL
  Filled 2020-05-28: qty 2

## 2020-05-28 MED ORDER — OXYCODONE HCL 5 MG PO TABS
ORAL_TABLET | ORAL | Status: AC
  Administered 2020-05-28: 10 mg via ORAL
  Filled 2020-05-28: qty 2

## 2020-05-28 MED ORDER — ENOXAPARIN SODIUM 40 MG/0.4ML ~~LOC~~ SOLN
SUBCUTANEOUS | Status: AC
  Administered 2020-05-28: 40 mg via SUBCUTANEOUS
  Filled 2020-05-28: qty 0.4

## 2020-05-28 MED ORDER — HYDROCHLOROTHIAZIDE 25 MG PO TABS: 25.0000 mg | ORAL_TABLET | Freq: Every morning | ORAL | 1 refills | Status: AC

## 2020-05-28 MED ORDER — ENOXAPARIN SODIUM 40 MG/0.4ML ~~LOC~~ SOLN: 40.0000 mg | SUBCUTANEOUS | 0 refills | Status: DC

## 2020-05-28 MED ORDER — DICLOFENAC SODIUM 75 MG PO TBEC
75.0000 mg | DELAYED_RELEASE_TABLET | Freq: Two times a day (BID) | ORAL | 0 refills | Status: DC | PRN
Start: 2020-05-30 — End: 2021-03-10

## 2020-05-28 MED ORDER — ASPIRIN 81 MG PO CHEW
CHEWABLE_TABLET | ORAL | Status: AC
  Administered 2020-05-28: 81 mg
  Filled 2020-05-28: qty 1

## 2020-05-28 MED ORDER — OXYCODONE-ACETAMINOPHEN 10-325 MG PO TABS: 1.0000 | ORAL_TABLET | Freq: Four times a day (QID) | ORAL | 0 refills | Status: DC | PRN

## 2020-05-28 NOTE — Progress Notes (Signed)
AVSS. BP down from yesterday. Elected to hold lisinopril (diuretic on hold as well). Lungs: Clear. Inspirex at 1000. Cardio: RR. ABD: Minimal distension, soft. BS+. Wound: Small amount of old blood. Extrem: Soft.  Small BM today. Plan: Advance diet, assess for d/c later today or in AM.  She will be d/c'd home on Lovenox for 2 weeks. She is familiar with this medication from when she had her knee replacement.

## 2020-05-28 NOTE — Final Progress Note (Signed)
Did well with diet advance. Has ambulated the length of the hospital. No nausea. Scant abdominal pain.  Home today.

## 2020-05-28 NOTE — Progress Notes (Signed)
Pt's IV infiltrated. IV removed and dsg applied. Notified Dr. Bary Castilla that pt states she does not want another IV started because she may go home today. Per Dr. Bary Castilla, no need to restart IV.

## 2020-06-01 DIAGNOSIS — C182 Malignant neoplasm of ascending colon: Secondary | ICD-10-CM

## 2020-06-01 HISTORY — DX: Malignant neoplasm of ascending colon: C18.2

## 2020-06-01 NOTE — Discharge Summary (Signed)
Physician Discharge Summary  Patient ID: Annette Mccullough MRN: [id number] DOB/AGE: 10-Dec-1945 74 y.o.  Admit date: 05/26/2020 Discharge date: 06/01/2020  Admission Diagnoses:  Discharge Diagnoses:  Active Problems:   Colon cancer [facility])   Discharged Condition: good  Hospital Course: Admitted for right hemicolectomy.  Underwent laparoscopically assisted procedure without incident.  Never left Day Surgery after procedure. Tolerated liquids well and diet advanced after first spontaneous bowel movement.  Normal cardio-pulmonary exam.   Consults: None  Significant Diagnostic Studies: Pathology. A. COLON, RIGHT; HEMICOLECTOMY:  - MODERATELY DIFFERENTIATED ADENOCARCINOMA.  - THIRTY-ONE LYMPH NODES, NEGATIVE FOR METASTATIC CARCINOMA (0/31).  - TUBULAR ADENOMA (CECUM).  Tumor Site: Ascending  Tumor Size: Greatest dimension: 6.1 x 4.6 x 1.1 cm  Macroscopic Tumor Perforation: Not identified  Histologic Type: Adenocarcinoma  Histologic Grade: G2, moderately differentiated  Tumor Extension: Invades through muscularis propria into peri-colorectal  tissue  Margins: All margins negative for invasive carcinoma  Treatment Effect: No known presurgical treatment  Lymphovascular Invasion: Present  Perineural Invasion: Not identified  Tumor Deposits: Not identified  Regional Lymph Nodes: All regional lymph nodes negative for tumor  Number of lymph nodes examined: 31  Pathologic Stage Classification (pTNM, AJCC 8th Edition): pT3 pN0   Treatments: IV hydration  Discharge Exam: Blood pressure 111/67, pulse 78, temperature (!) 97.4 F (36.3 C), resp. rate 18, height 5\' 7"  (1.702 m), weight 116 kg, SpO2 99 %. General appearance: alert and cooperative Resp: clear to auscultation bilaterally Cardio: regular rate and rhythm, S1, S2 normal, no murmur, click, rub or gallop GI: soft, non-tender; bowel sounds normal; no masses,  no organomegaly Incision/Wound: Wound with scant amount of old blood at  inferior aspect.   Patient discharged on Lovenox for 14 days to minimize the risk of DVT.   Disposition: Discharge disposition: 01-Home or Self Care       Discharge Instructions    Diet - low sodium heart healthy   Complete by: As directed    Discharge instructions   Complete by: As directed    Heating pad to abdomen for comfort.  OK to shower with assistance.  Tylenol: If needed for soreness.  You make take an additional Tylenol tablet (325 mg) with your 4x/d Percocet to minimize discomfort.  Do not restart Hydrodiuril until Saturday, August 28th.  Do not restart Voltaren until Saturday, August 27th.  Eat modestly.    Walk frequently.   Use your incentive spirometer frequently.  You may remove the outer plastic dressings on Friday if desired.    Call for questions:  [phone]  Dr. Bary Castilla   Increase activity slowly   Complete by: As directed    No wound care   Complete by: As directed      Allergies as of 05/28/2020      Reactions   Eggs Or Egg-derived Products Other (See Comments)   Pt. States egg yoke causes abdominal discomfort if she eats fresh eggs   Tape Swelling   Blisters, paper tape is ok      Medication List    TAKE these medications   aspirin EC 81 MG tablet Take 81 mg by mouth daily. Swallow whole.   cyclobenzaprine 10 MG tablet Commonly known as: FLEXERIL Take 10 mg by mouth 3 (three) times daily as needed for muscle spasms.   diclofenac 75 MG EC tablet Commonly known as: VOLTAREN Take 1 tablet (75 mg total) by mouth 2 (two) times daily as needed. What changed: reasons to take this   diphenhydrAMINE 25  MG tablet Commonly known as: BENADRYL Take 25 mg by mouth daily as needed for allergies.   diphenhydrAMINE 50 MG tablet Commonly known as: BENADRYL Take 50 mg by mouth at bedtime as needed for sleep.   docusate sodium 100 MG capsule Commonly known as: COLACE Take 100 mg by mouth 2 (two) times daily.   DULoxetine 60 MG  capsule Commonly known as: CYMBALTA Take 60 mg by mouth daily.   enoxaparin 40 MG/0.4ML injection Commonly known as: LOVENOX Inject 0.4 mLs (40 mg total) into the skin daily. Starting Thursday, August 25.   ferrous sulfate 325 (65 FE) MG tablet Take 325 mg by mouth daily. Take with 4 oz of orange juice   hydrochlorothiazide 25 MG tablet Commonly known as: HYDRODIURIL Take 1 tablet (25 mg total) by mouth every morning.   hydroxypropyl methylcellulose / hypromellose 2.5 % ophthalmic solution Commonly known as: ISOPTO TEARS / GONIOVISC Place 2 drops into both eyes 4 (four) times daily as needed (for dry eyes).   lisinopril 20 MG tablet Commonly known as: ZESTRIL Take 1 tablet (20 mg total) by mouth daily. What changed: when to take this   metoprolol tartrate 25 MG tablet Commonly known as: LOPRESSOR Take 25 mg by mouth daily.   oxyCODONE-acetaminophen 10-325 MG tablet Commonly known as: PERCOCET Take 1 tablet by mouth 4 (four) times daily. What changed: Another medication with the same name was added. Make sure you understand how and when to take each.   oxyCODONE-acetaminophen 10-325 MG tablet Commonly known as: Percocet Take 1 tablet by mouth every 6 (six) hours as needed for pain. What changed: You were already taking a medication with the same name, and this prescription was added. Make sure you understand how and when to take each.   pantoprazole 40 MG tablet Commonly known as: PROTONIX Take 40 mg by mouth daily before breakfast.   potassium chloride SA 20 MEQ tablet Commonly known as: KLOR-CON Take 20 mEq by mouth daily.        Signed: Forest Gleason Fleet Higham 06/01/2020, 4:44 PM

## 2020-06-05 ENCOUNTER — Other Ambulatory Visit: Payer: Self-pay | Admitting: General Surgery

## 2020-06-05 DIAGNOSIS — C182 Malignant neoplasm of ascending colon: Secondary | ICD-10-CM

## 2020-06-05 LAB — SURGICAL PATHOLOGY

## 2020-06-10 ENCOUNTER — Inpatient Hospital Stay: Payer: Medicare Other | Attending: Internal Medicine | Admitting: Internal Medicine

## 2020-06-10 ENCOUNTER — Encounter: Payer: Self-pay | Admitting: Internal Medicine

## 2020-06-10 ENCOUNTER — Inpatient Hospital Stay: Payer: Medicare Other

## 2020-06-10 ENCOUNTER — Other Ambulatory Visit: Payer: Self-pay

## 2020-06-10 VITALS — BP 108/63 | HR 77 | Temp 98.7°F | Resp 18 | Ht 67.0 in | Wt 248.0 lb

## 2020-06-10 DIAGNOSIS — Z87891 Personal history of nicotine dependence: Secondary | ICD-10-CM | POA: Diagnosis not present

## 2020-06-10 DIAGNOSIS — G8929 Other chronic pain: Secondary | ICD-10-CM | POA: Diagnosis not present

## 2020-06-10 DIAGNOSIS — Z803 Family history of malignant neoplasm of breast: Secondary | ICD-10-CM

## 2020-06-10 DIAGNOSIS — D509 Iron deficiency anemia, unspecified: Secondary | ICD-10-CM

## 2020-06-10 DIAGNOSIS — N281 Cyst of kidney, acquired: Secondary | ICD-10-CM | POA: Insufficient documentation

## 2020-06-10 DIAGNOSIS — M545 Low back pain: Secondary | ICD-10-CM | POA: Insufficient documentation

## 2020-06-10 DIAGNOSIS — C182 Malignant neoplasm of ascending colon: Secondary | ICD-10-CM | POA: Insufficient documentation

## 2020-06-10 LAB — CBC WITH DIFFERENTIAL/PLATELET
Abs Immature Granulocytes: 0.01 10*3/uL (ref 0.00–0.07)
Basophils Absolute: 0.1 10*3/uL (ref 0.0–0.1)
Basophils Relative: 1 %
Eosinophils Absolute: 0.3 10*3/uL (ref 0.0–0.5)
Eosinophils Relative: 4 %
HCT: 31.3 % — ABNORMAL LOW (ref 36.0–46.0)
Hemoglobin: 10.1 g/dL — ABNORMAL LOW (ref 12.0–15.0)
Immature Granulocytes: 0 %
Lymphocytes Relative: 21 %
Lymphs Abs: 1.5 10*3/uL (ref 0.7–4.0)
MCH: 28.1 pg (ref 26.0–34.0)
MCHC: 32.3 g/dL (ref 30.0–36.0)
MCV: 86.9 fL (ref 80.0–100.0)
Monocytes Absolute: 0.4 10*3/uL (ref 0.1–1.0)
Monocytes Relative: 5 %
Neutro Abs: 5 10*3/uL (ref 1.7–7.7)
Neutrophils Relative %: 69 %
Platelets: 385 10*3/uL (ref 150–400)
RBC: 3.6 MIL/uL — ABNORMAL LOW (ref 3.87–5.11)
RDW: 15.7 % — ABNORMAL HIGH (ref 11.5–15.5)
WBC: 7.2 10*3/uL (ref 4.0–10.5)
nRBC: 0 % (ref 0.0–0.2)

## 2020-06-10 LAB — COMPREHENSIVE METABOLIC PANEL
ALT: 11 U/L (ref 0–44)
AST: 14 U/L — ABNORMAL LOW (ref 15–41)
Albumin: 3.7 g/dL (ref 3.5–5.0)
Alkaline Phosphatase: 75 U/L (ref 38–126)
Anion gap: 9 (ref 5–15)
BUN: 22 mg/dL (ref 8–23)
CO2: 25 mmol/L (ref 22–32)
Calcium: 9.2 mg/dL (ref 8.9–10.3)
Chloride: 105 mmol/L (ref 98–111)
Creatinine, Ser: 0.81 mg/dL (ref 0.44–1.00)
GFR calc Af Amer: >60 mL/min (ref >60)
GFR calc non Af Amer: >60 mL/min (ref >60)
Glucose, Bld: 107 mg/dL — ABNORMAL HIGH (ref 70–99)
Potassium: 4.2 mmol/L (ref 3.5–5.1)
Sodium: 139 mmol/L (ref 135–145)
Total Bilirubin: 0.3 mg/dL (ref 0.3–1.2)
Total Protein: 7.2 g/dL (ref 6.5–8.1)

## 2020-06-10 LAB — IRON AND TIBC
Iron: 88 ug/dL (ref 28–170)
Saturation Ratios: 25 % (ref 10.4–31.8)
TIBC: 357 ug/dL (ref 250–450)
UIBC: 269 ug/dL

## 2020-06-10 LAB — FERRITIN: Ferritin: 9 ng/mL — ABNORMAL LOW (ref 11–307)

## 2020-06-10 NOTE — Progress Notes (Signed)
[place] NOTE  Patient Care Team: Baxter Hire, MD as PCP - General (Internal Medicine) Clent Jacks, RN as Oncology Nurse Navigator  CHIEF COMPLAINTS/PURPOSE OF CONSULTATION: COLON CANCER  #  Oncology History Overview Note  # AUG 2021-Colon cancer -s/p right hemicolectomy; Dr. Tollie Pizza; moderately differentiated; pT3;pN0-0/31 LN; STage II [no tumor perforation; negative margins; moderate differentiated; positive for LVI; MSI stable]; signatera   # Chronic back pain [pain clinic- on oxycodone]; PN- ? Chronic back pain.   # SURVIVORSHIP:   # GENETICS: NONE  DIAGNOSIS: colon cancer- right  STAGE:   II      ;  GOALS: cure  CURRENT/MOST RECENT THERAPY :    Cancer of right colon ([place])  05/26/2020 Initial Diagnosis   Cancer of right colon ([place])      HISTORY OF PRESENTING ILLNESS:  Annette Mccullough 75 y.o.  female with no significant past medical history except for chronic back pain-is here to review results of the pathology/management for recently diagnosed colon cancer.  Patient states that she never had a previous colonoscopy.  However she noted to have intermittent cramping in the epigastric region in the last 3 to 4 months.  This led to a CT scan that showed concerning mass in the ascending colon.  She went on to have a colonoscopy-which showed the lesion.  She went on to have right hemicolectomy.  She is recovering well from surgery.  Denies any worsening pain abdominal pain nausea vomiting.  No diarrhea constipation.  Review of Systems  Constitutional: Positive for malaise/fatigue. Negative for chills, diaphoresis, fever and weight loss.  HENT: Negative for nosebleeds and sore throat.   Eyes: Negative for double vision.  Respiratory: Negative for cough, hemoptysis, sputum production, shortness of breath and wheezing.   Cardiovascular: Negative for chest pain, palpitations, orthopnea and leg swelling.  Gastrointestinal: Negative for  abdominal pain, blood in stool, constipation, diarrhea, heartburn, melena, nausea and vomiting.  Genitourinary: Negative for dysuria, frequency and urgency.  Musculoskeletal: Positive for back pain and joint pain.  Skin: Negative.  Negative for itching and rash.  Neurological: Positive for tingling. Negative for dizziness, focal weakness, weakness and headaches.  Endo/Heme/Allergies: Does not bruise/bleed easily.  Psychiatric/Behavioral: Negative for depression. The patient is not nervous/anxious and does not have insomnia.      MEDICAL HISTORY:  Past Medical History:  Diagnosis Date  . Arthritis    multiple areas  . Blood transfusion    74- post childbirth & /w joint replacement   . Cancer ([place])   . Chronic kidney disease    renal calculi /w pregnancy  . Complication of anesthesia    post anesthesia - swelling of lip- relative to tape  . Family history of adverse reaction to anesthesia    sister- slow to wake up and " has died once "   . Fibromyalgia   . GERD (gastroesophageal reflux disease)   . Heart murmur    rec's antibiotic prior to dental work, never had any cardiac testing done  . History of kidney stones   . Hypertension   . Neuromuscular disorder ([place])    DDD- lumbar & cervical   . Psoriasis   . Seasonal allergies   . 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 ABDOMINAL HYSTERECTOMY    . APPENDECTOMY    . CERVICAL FUSION  2011  . CESAREAN SECTION     2 times   . COLONOSCOPY WITH PROPOFOL N/A 05/07/2020   Procedure: COLONOSCOPY WITH PROPOFOL;  Surgeon: Robert Bellow, MD;  Location: [facility] ENDOSCOPY;  Service: Endoscopy;  Laterality: N/A;  . CYST EXCISION Right    hand, and both ovaries  . ESOPHAGOGASTRODUODENOSCOPY N/A 05/07/2020   Procedure: ESOPHAGOGASTRODUODENOSCOPY (EGD);  Surgeon: Robert Bellow, MD;  [facility] ENDOSCOPY;  Service:  Endoscopy;  Laterality: N/A;  . EYE SURGERY     bilateral cataract surgery   . JOINT REPLACEMENT     1999- R knee  . LAPAROSCOPIC RIGHT COLECTOMY Right 05/26/2020   Procedure: LAPAROSCOPIC RIGHT HEMI-COLECTOMY;  Surgeon: Robert Bellow, MD;  Location: [facility] ORS;  Service: General;  Laterality: Right;  . LUMBAR WOUND DEBRIDEMENT  09/15/2011   Procedure: LUMBAR WOUND DEBRIDEMENT;  Surgeon: Dahlia Bailiff;  Location: [place];  Service: Orthopedics;  Laterality: N/A;  REPEAT I&D AND WOUND CLOSURE OF SPINAL WOUND  . OVARIAN CYST REMOVAL    . SPINAL CORD STIMULATOR BATTERY EXCHANGE Left 02/07/2013   Procedure: REMOVAL OF HARDWARE (BATTERY REMOVAL);  Surgeon: Melina Schools, MD;  Location: [place];  Service: Orthopedics;  Laterality: Left;  removal of spinal cord stimulator battery  . SPINAL CORD STIMULATOR INSERTION  08/25/2011   Procedure: LUMBAR SPINAL CORD STIMULATOR INSERTION;  Surgeon: Dahlia Bailiff;  Location: [place];  Service: Orthopedics;  Laterality: N/A;  . TONSILLECTOMY     as a child  . TOTAL KNEE REVISION Right 01/21/2016   Procedure: RIGHT TOTAL KNEE REVISION;  Surgeon: Gaynelle Arabian, MD;  Location: [facility] ORS;  Service: Orthopedics;  Laterality: Right;  . TRIGGER FINGER RELEASE Right    thumb    SOCIAL HISTORY: Social History   Socioeconomic History  . Marital status: Widowed    Spouse name: Not on file  . Number of children: Not on file  . Years of education: Not on file  . Highest education level: Not on file  Occupational History  . Not on file  Tobacco Use  . Smoking status: Former Smoker    Quit date: 08/17/2004    Years since quitting: 15.8  . Smokeless tobacco: Never Used  Vaping Use  . Vaping Use: Never used  Substance and Sexual Activity  . Alcohol use: Yes    Comment: rare  . Drug use: No  . Sexual activity: Not on file  Other Topics Concern  . 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    Social Determinants of Health   Financial Resource Strain:   . Difficulty of Paying Living Expenses: Not on file  Food Insecurity:   . Worried About Charity fundraiser in the Last Year: Not on file  . Ran Out of Food in the Last Year: Not on file  Transportation Needs:   . Lack of Transportation (Medical): Not on file  . Lack of Transportation (Non-Medical): Not on file  Physical Activity:   . Days of Exercise per Week: Not on file  . Minutes of Exercise per Session: Not on file  Stress:   . Feeling of Stress : Not on file  Social Connections:   . Frequency of Communication with Friends and Family: Not on file  . Frequency of Social Gatherings with Friends and Family: Not on file  . Attends Religious Services: Not on file  . Active  Member of Clubs or Organizations: Not on file  . Attends Archivist Meetings: Not on file  . Marital Status: Not on file  Intimate Partner Violence:   . Fear of Current or Ex-Partner: Not on file  . Emotionally Abused: Not on file  . Physically Abused: Not on file  . Sexually Abused: Not on file    FAMILY HISTORY: Family History  Problem Relation Age of Onset  . Anesthesia problems Sister   . Breast cancer Sister 37  . Breast cancer Mother 73  . Breast cancer Maternal Grandmother 60    ALLERGIES:  is allergic to eggs or egg-derived products and tape.  MEDICATIONS:  Current Outpatient Medications  Medication Sig Dispense Refill  . aspirin EC 81 MG tablet Take 81 mg by mouth daily. Swallow whole.    . cyclobenzaprine (FLEXERIL) 10 MG tablet Take 10 mg by mouth 3 (three) times daily as needed for muscle spasms.    . diclofenac (VOLTAREN) 75 MG EC tablet Take 1 tablet (75 mg total) by mouth 2 (two) times daily as needed. 1 tablet 0  . diphenhydrAMINE (BENADRYL) 25 MG tablet Take 25 mg by mouth daily as needed for allergies.    . diphenhydrAMINE (BENADRYL) 50 MG tablet Take 50 mg by mouth at bedtime  as needed for sleep.     Marland Kitchen docusate sodium (COLACE) 100 MG capsule Take 100 mg by mouth 2 (two) times daily.     . DULoxetine (CYMBALTA) 60 MG capsule Take 60 mg by mouth daily.      . ferrous sulfate 325 (65 FE) MG tablet Take 325 mg by mouth daily. Take with 4 oz of orange juice    . hydrochlorothiazide (HYDRODIURIL) 25 MG tablet Take 1 tablet (25 mg total) by mouth every morning. 1 tablet 1  . lisinopril (ZESTRIL) 20 MG tablet Take 1 tablet (20 mg total) by mouth daily. 1 tablet 1  . metoprolol tartrate (LOPRESSOR) 25 MG tablet Take 25 mg by mouth daily.    Marland Kitchen oxyCODONE-acetaminophen (PERCOCET) 10-325 MG tablet Take 1 tablet by mouth 4 (four) times daily.    Marland Kitchen oxyCODONE-acetaminophen (PERCOCET) 10-325 MG tablet Take 1 tablet by mouth every 6 (six) hours as needed for pain. 12 tablet 0  . pantoprazole (PROTONIX) 40 MG tablet Take 40 mg by mouth daily before breakfast.    . potassium chloride SA (KLOR-CON) 20 MEQ tablet Take 20 mEq by mouth daily.    Marland Kitchen enoxaparin (LOVENOX) 40 MG/0.4ML injection Inject 0.4 mLs (40 mg total) into the skin daily. Starting Thursday, August 25. (Patient not taking: Reported on 06/10/2020) 12 mL 0  . hydroxypropyl methylcellulose (ISOPTO TEARS) 2.5 % ophthalmic solution Place 2 drops into both eyes 4 (four) times daily as needed (for dry eyes). (Patient not taking: Reported on 05/26/2020)     No current facility-administered medications for this visit.      Marland Kitchen  PHYSICAL EXAMINATION: ECOG PERFORMANCE STATUS: 0 - Asymptomatic  Vitals:   06/10/20 1106  BP: 108/63  Pulse: 77  Resp: 18  Temp: 98.7 F (37.1 C)  SpO2: 99%   Filed Weights   06/10/20 1106  Weight: 248 lb (112.5 kg)    Physical Exam HENT:     Head: Normocephalic and atraumatic.     Mouth/Throat:     Pharynx: No oropharyngeal exudate.  Eyes:     Pupils: Pupils are equal, round, and reactive to light.  Cardiovascular:     Rate and Rhythm: Normal rate and  regular rhythm.  Pulmonary:      Effort: Pulmonary effort is normal. No respiratory distress.     Breath sounds: Normal breath sounds. No wheezing.  Abdominal:     General: Bowel sounds are normal. There is no distension.     Palpations: Abdomen is soft. There is no mass.     Tenderness: There is no abdominal tenderness. There is no guarding or rebound.  Musculoskeletal:        General: No tenderness. Normal range of motion.     Cervical back: Normal range of motion and neck supple.  Skin:    General: Skin is warm.  Neurological:     Mental Status: She is alert and oriented to person, place, and time.  Psychiatric:        Mood and Affect: Affect normal.      LABORATORY DATA:  I have reviewed the data as listed Lab Results  Component Value Date   WBC 12.9 (H) 01/24/2016   HGB 9.2 (L) 01/24/2016   HCT 27.0 (L) 01/24/2016   MCV 93.4 01/24/2016   PLT 243 01/24/2016   Recent Labs    05/26/20 2251  NA 137  K 3.8  CL 100  CO2 23  GLUCOSE 173*  BUN 18  CREATININE 0.92  CALCIUM 9.2  GFRNONAA >60  GFRAA >60    RADIOGRAPHIC STUDIES: I have personally reviewed the radiological images as listed and agreed with the findings in the report. CT ABDOMEN PELVIS W CONTRAST  Result Date: 05/20/2020 CLINICAL DATA:  Colorectal cancer, staging examination EXAM: CT ABDOMEN AND PELVIS WITH CONTRAST TECHNIQUE: Multidetector CT imaging of the abdomen and pelvis was performed using the standard protocol following bolus administration of intravenous contrast. CONTRAST:  127m OMNIPAQUE IOHEXOL 300 MG/ML  SOLN COMPARISON:  02/25/2012 FINDINGS: Lower chest: The visualized lung bases are clear bilaterally. The visualized heart and pericardium are unremarkable. Hepatobiliary: Mild hepatic steatosis. No focal intrahepatic masses. Gallbladder unremarkable. No intra or extrahepatic biliary ductal dilation. Pancreas: Unremarkable Spleen: Unremarkable Adrenals/Urinary Tract: The adrenal glands are unremarkable. The kidneys are normal in  position. 2 mm nonobstructing calculus is seen within the lower pole of the left kidney. There is mild bilateral renal cortical atrophy noted. Indeterminate 10 mm low-attenuation lesion arises exophytically from the upper pole of the left kidney possibly representing a hyperdense renal cyst or a cystic renal mass. No hydronephrosis. The bladder is unremarkable. Stomach/Bowel: Severe sigmoid diverticulosis. There is asymmetric wall thickening involving the anti mesenteric wall of the colon at the level of the ileocecal junction likely representing the patient's known primary colonic malignancy. There is pericolonic soft tissue infiltration in this region most in keeping with transmural extension of the primary mass. No evidence of obstruction. Several tiny pericolonic soft tissue nodules are identified suspicious for pathologic regional adenopathy, by example at axial image # 60/2, however, the do not meet size criteria for pathologic enlargement. There is no frankly pathologic adenopathy within the abdomen and pelvis. The stomach and small bowel are otherwise unremarkable. The appendix is absent. Suture related to infraumbilical laparotomy incision is noted. No free intraperitoneal gas or fluid. Vascular/Lymphatic: The abdominal vasculature is age-appropriate with mild aortoiliac atherosclerotic calcification. No aneurysm. Reproductive: Status post hysterectomy. No adnexal masses. Other: Rectum unremarkable. Dorsal column stimulator leads extend into the thoracic spine beyond the margin of the examination. These leads appear residual as the left subcutaneous battery pack has been removed. Musculoskeletal: Degenerative changes are seen within the lumbar spine. No lytic or blastic bone  lesions are identified. IMPRESSION: 1. Asymmetric wall thickening involving the anti mesenteric wall of the colon at the level of the ileocecal junction likely representing the patient's known primary colonic malignancy. There is  pericolonic soft tissue infiltration in this region most in keeping with transmural extension of the primary mass. No pathologically enlarged regional adenopathy or evidence of distant metastatic disease. 2. Indeterminate 10 mm low-attenuation lesion arising exophytically from the upper pole of the left kidney. This could represent a hyperdense renal cyst or cystic renal mass. Follow-up renal ultrasound is recommended. 3. Severe sigmoid diverticulosis. Aortic Atherosclerosis (ICD10-I70.0). Electronically Signed   By: Fidela Salisbury MD   On: 05/20/2020 14:17    ASSESSMENT & PLAN:   Cancer of right colon ([place]) #Right-sided colon cancer-stage II; no significant high-risk features except for LVI; and the fact that tumor is MSI stable.  Discussed that in general surgery itself should cure the cancer about 80% the time.  In general given the absence of high risk features-not recommend any adjuvant chemotherapy.  #However discussed that the option of offering circulating tumor DNA [signetera] to help assess the risk of recurrence; also surveillance down the line.  Patient will need tumor tissue/and also serial blood work for circulating tumor cell severance.  If significantly high risk noted on circulating tumor DNA testing-adjuvant 5-FU could be offered.  However clinically this is going to be less likely.  After extensive discussion with the patient-patient interested in the testing.  Understands that it will be 3 to 4 weeks before we get the results of the test.  Will order today.  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  Check labs today iron studies today.  If low would recommend p.o. iron/IV iron.  # LEFT kidney cyst 1 cm in size would recommend ultrasound down the line.  Thank you Dr.Byrnett for allowing me to participate in the care of your pleasant patient. Please do not hesitate to contact me with questions or concerns in the interim. Discussed with  Dr.Byrnett.   # DISPOSITION: # labs today- cbc/iron studies/ferritin/cmp;CEA' signatera # follow up in 4 weeks-labs-[signatera] - Dr.B  Cc; Dr.Johnston/Byrnett.   All questions were answered. The patient knows to call the clinic with any problems, questions or concerns.    Annette Sickle, MD 06/10/2020 12:17 PM

## 2020-06-10 NOTE — Assessment & Plan Note (Addendum)
#  Right-sided colon cancer-stage II; no significant high-risk features except for LVI; and the fact that tumor is MSI stable.  Discussed that in general surgery itself should cure the cancer about 80% the time.  In general given the absence of high risk features-not recommend any adjuvant chemotherapy.  #However discussed that the option of offering circulating tumor DNA [signetera] to help assess the risk of recurrence; also surveillance down the line.  Patient will need tumor tissue/and also serial blood work for circulating tumor cell severance.  If significantly high risk noted on circulating tumor DNA testing-adjuvant 5-FU could be offered.  However clinically this is going to be less likely.  After extensive discussion with the patient-patient interested in the testing.  Understands that it will be 3 to 4 weeks before we get the results of the test.  Will order today.  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  Check labs today iron studies today.  If low would recommend p.o. iron/IV iron.  # LEFT kidney cyst 1 cm in size would recommend ultrasound down the line.  Thank you Dr.Byrnett for allowing me to participate in the care of your pleasant patient. Please do not hesitate to contact me with questions or concerns in the interim. Discussed with Dr.Byrnett.   # DISPOSITION: # labs today- cbc/iron studies/ferritin/cmp;CEA' signatera # follow up in 4 weeks-labs-[signatera] - Dr.B  Cc; Dr.Johnston/Byrnett.

## 2020-06-11 LAB — CEA: CEA: 3.4 ng/mL (ref 0.0–4.7)

## 2020-06-18 ENCOUNTER — Inpatient Hospital Stay: Payer: Medicare Other

## 2020-06-23 ENCOUNTER — Other Ambulatory Visit: Payer: Self-pay | Admitting: Internal Medicine

## 2020-06-23 DIAGNOSIS — N281 Cyst of kidney, acquired: Secondary | ICD-10-CM

## 2020-06-23 DIAGNOSIS — Z1231 Encounter for screening mammogram for malignant neoplasm of breast: Secondary | ICD-10-CM

## 2020-07-02 ENCOUNTER — Ambulatory Visit
Admission: RE | Admit: 2020-07-02 | Discharge: 2020-07-02 | Disposition: A | Discharge status: Home / Self Care | Payer: Medicare Other | Source: Ambulatory Visit | Attending: Internal Medicine | Admitting: Internal Medicine

## 2020-07-02 ENCOUNTER — Other Ambulatory Visit: Payer: Self-pay

## 2020-07-02 DIAGNOSIS — N281 Cyst of kidney, acquired: Secondary | ICD-10-CM | POA: Insufficient documentation

## 2020-07-04 ENCOUNTER — Encounter: Payer: Self-pay | Admitting: Internal Medicine

## 2020-07-08 ENCOUNTER — Inpatient Hospital Stay: Payer: Medicare Other | Attending: Internal Medicine

## 2020-07-08 ENCOUNTER — Other Ambulatory Visit: Payer: Self-pay

## 2020-07-08 ENCOUNTER — Inpatient Hospital Stay (HOSPITAL_BASED_OUTPATIENT_CLINIC_OR_DEPARTMENT_OTHER): Payer: Medicare Other | Admitting: Internal Medicine

## 2020-07-08 ENCOUNTER — Encounter: Payer: Self-pay | Admitting: Internal Medicine

## 2020-07-08 DIAGNOSIS — N281 Cyst of kidney, acquired: Secondary | ICD-10-CM | POA: Insufficient documentation

## 2020-07-08 DIAGNOSIS — Z87891 Personal history of nicotine dependence: Secondary | ICD-10-CM | POA: Diagnosis not present

## 2020-07-08 DIAGNOSIS — C182 Malignant neoplasm of ascending colon: Secondary | ICD-10-CM | POA: Diagnosis present

## 2020-07-08 DIAGNOSIS — D509 Iron deficiency anemia, unspecified: Secondary | ICD-10-CM | POA: Insufficient documentation

## 2020-07-08 NOTE — Progress Notes (Signed)
[facility] NOTE  Patient Care Team: Baxter Hire, MD as PCP - General (Internal Medicine) Clent Jacks, RN as Oncology Nurse Navigator  CHIEF COMPLAINTS/PURPOSE OF CONSULTATION: COLON CANCER  #  Oncology History Overview Note  # AUG 2021-Colon cancer -s/p right hemicolectomy; Dr. Tollie Pizza; moderately differentiated; pT3;pN0-0/31 LN; STage II [no tumor perforation; negative margins; moderate differentiated; positive for LVI; MSI stable]; [facility]a- SEP 2021- NEGATIVE FOR CT-DNA  #No adjuvant therapy   # Chronic back pain [pain clinic- on oxycodone]; PN- ? Chronic back pain.   # SURVIVORSHIP:   # GENETICS: NONE  DIAGNOSIS: colon cancer- right  STAGE:   II      ;  GOALS: cure  CURRENT/MOST RECENT THERAPY : Surveillance   Cancer of right colon ([place])  05/26/2020 Initial Diagnosis   Cancer of right colon ([place])      HISTORY OF PRESENTING ILLNESS:  Annette Mccullough 74 y.o.  female patient with above diagnosis of stage II right-sided colon cancer is here for follow-up/review the results.  Patient denies any blood in stools or black or stools.  No nausea vomiting.  Appetite is good.  She stated she is back to baseline.  Review of Systems  Constitutional: Positive for malaise/fatigue. Negative for chills, diaphoresis, fever and weight loss.  HENT: Negative for nosebleeds and sore throat.   Eyes: Negative for double vision.  Respiratory: Negative for cough, hemoptysis, sputum production, shortness of breath and wheezing.   Cardiovascular: Negative for chest pain, palpitations, orthopnea and leg swelling.  Gastrointestinal: Negative for abdominal pain, blood in stool, constipation, diarrhea, heartburn, melena, nausea and vomiting.  Genitourinary: Negative for dysuria, frequency and urgency.  Musculoskeletal: Positive for back pain and joint pain.  Skin: Negative.  Negative for itching and rash.  Neurological: Positive for tingling. Negative for  dizziness, focal weakness, weakness and headaches.  Endo/Heme/Allergies: Does not bruise/bleed easily.  Psychiatric/Behavioral: Negative for depression. The patient is not nervous/anxious and does not have insomnia.      MEDICAL HISTORY:  Past Medical History:  Diagnosis Date  . Arthritis    multiple areas  . Blood transfusion    74- post childbirth & /w joint replacement   . Cancer ([place])   . Chronic kidney disease    renal calculi /w pregnancy  . Complication of anesthesia    post anesthesia - swelling of lip- relative to tape  . Family history of adverse reaction to anesthesia    sister- slow to wake up and " has died once "   . Fibromyalgia   . GERD (gastroesophageal reflux disease)   . Heart murmur    rec's antibiotic prior to dental work, never had any cardiac testing done  . History of kidney stones   . Hypertension   . Neuromuscular disorder ([place])    DDD- lumbar & cervical   . Psoriasis   . Seasonal allergies   . 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 ABDOMINAL HYSTERECTOMY    . APPENDECTOMY    . CERVICAL FUSION     2011  . CESAREAN SECTION     2 times   . COLONOSCOPY WITH PROPOFOL N/A 05/07/2020   Procedure: COLONOSCOPY WITH PROPOFOL;  Surgeon: Robert Bellow, MD;  Location: [facility] ENDOSCOPY;  Service: Endoscopy;  Laterality: N/A;  . CYST EXCISION Right    hand, and both  ovaries  . ESOPHAGOGASTRODUODENOSCOPY N/A 05/07/2020   Procedure: ESOPHAGOGASTRODUODENOSCOPY (EGD);  Surgeon: Robert Bellow, MD;  Location: [facility] ENDOSCOPY;  Service: Endoscopy;  Laterality: N/A;  . EYE SURGERY     bilateral cataract surgery   . JOINT REPLACEMENT     1999- R knee  . LAPAROSCOPIC RIGHT COLECTOMY Right 05/26/2020   Procedure: LAPAROSCOPIC RIGHT HEMI-COLECTOMY;  Surgeon: Robert Bellow, MD;  Location: [facility] ORS;  Service: General;  Laterality: Right;  . LUMBAR WOUND  DEBRIDEMENT  09/15/2011   Procedure: LUMBAR WOUND DEBRIDEMENT;  Surgeon: Dahlia Bailiff;  Location: [facility];  Service: Orthopedics;  Laterality: N/A;  REPEAT I&D AND WOUND CLOSURE OF SPINAL WOUND  . OVARIAN CYST REMOVAL    . SPINAL CORD STIMULATOR BATTERY EXCHANGE Left 02/07/2013   Procedure: REMOVAL OF HARDWARE (BATTERY REMOVAL);  Surgeon: Melina Schools, MD;  Location: [facility];  Service: Orthopedics;  Laterality: Left;  removal of spinal cord stimulator battery  . SPINAL CORD STIMULATOR INSERTION  08/25/2011   Procedure: LUMBAR SPINAL CORD STIMULATOR INSERTION;  Surgeon: Dahlia Bailiff;  Location: [facility];  Service: Orthopedics;  Laterality: N/A;  . TONSILLECTOMY     as a child  . TOTAL KNEE REVISION Right 01/21/2016   Procedure: RIGHT TOTAL KNEE REVISION;  Surgeon: Gaynelle Arabian, MD;  Location: [facility] ORS;  Service: Orthopedics;  Laterality: Right;  . TRIGGER FINGER RELEASE Right    thumb    SOCIAL HISTORY: Social History   Socioeconomic History  . Marital status: Widowed    Spouse name: Not on file  . Number of children: Not on file  . Years of education: Not on file  . Highest education level: Not on file  Occupational History  . Not on file  Tobacco Use  . Smoking status: Former Smoker    Quit date: 08/17/2004    Years since quitting: 15.9  . Smokeless tobacco: Never Used  Vaping Use  . Vaping Use: Never used  Substance and Sexual Activity  . Alcohol use: Yes    Comment: rare  . Drug use: No  . Sexual activity: Not on file  Other Topics Concern  . 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    Social Determinants of Health   Financial Resource Strain:   . Difficulty of Paying Living Expenses: Not on file  Food Insecurity:   . Worried About Charity fundraiser in the Last Year: Not on file  . Ran Out of Food in the Last Year: Not on file  Transportation Needs:   . Lack of  Transportation (Medical): Not on file  . Lack of Transportation (Non-Medical): Not on file  Physical Activity:   . Days of Exercise per Week: Not on file  . Minutes of Exercise per Session: Not on file  Stress:   . Feeling of Stress : Not on file  Social Connections:   . Frequency of Communication with Friends and Family: Not on file  . Frequency of Social Gatherings with Friends and Family: Not on file  . Attends Religious Services: Not on file  . Active Member of Clubs or Organizations: Not on file  . Attends Archivist Meetings: Not on file  . Marital Status: Not on file  Intimate Partner Violence:   . Fear of Current or Ex-Partner: Not on file  . Emotionally Abused: Not on file  . Physically Abused: Not  on file  . Sexually Abused: Not on file    FAMILY HISTORY: Family History  Problem Relation Age of Onset  . Anesthesia problems Sister   . Breast cancer Sister 78  . Breast cancer Mother 4  . Breast cancer Maternal Grandmother 60    ALLERGIES:  is allergic to eggs or egg-derived products and tape.  MEDICATIONS:  Current Outpatient Medications  Medication Sig Dispense Refill  . aspirin EC 81 MG tablet Take 81 mg by mouth daily. Swallow whole.    . cyclobenzaprine (FLEXERIL) 10 MG tablet Take 10 mg by mouth 3 (three) times daily as needed for muscle spasms.    . diclofenac (VOLTAREN) 75 MG EC tablet Take 1 tablet (75 mg total) by mouth 2 (two) times daily as needed. 1 tablet 0  . diphenhydrAMINE (BENADRYL) 25 MG tablet Take 25 mg by mouth daily as needed for allergies.    . diphenhydrAMINE (BENADRYL) 50 MG tablet Take 50 mg by mouth at bedtime as needed for sleep.     Marland Kitchen docusate sodium (COLACE) 100 MG capsule Take 100 mg by mouth 2 (two) times daily.     . DULoxetine (CYMBALTA) 60 MG capsule Take 60 mg by mouth daily.      . ferrous sulfate 325 (65 FE) MG tablet Take 325 mg by mouth daily. Take with 4 oz of orange juice    . hydrochlorothiazide (HYDRODIURIL) 25  MG tablet Take 1 tablet (25 mg total) by mouth every morning. 1 tablet 1  . lisinopril (ZESTRIL) 20 MG tablet Take 1 tablet (20 mg total) by mouth daily. 1 tablet 1  . metoprolol tartrate (LOPRESSOR) 25 MG tablet Take 25 mg by mouth daily.    Marland Kitchen oxyCODONE-acetaminophen (PERCOCET) 10-325 MG tablet Take 1 tablet by mouth 4 (four) times daily.    Marland Kitchen oxyCODONE-acetaminophen (PERCOCET) 10-325 MG tablet Take 1 tablet by mouth every 6 (six) hours as needed for pain. 12 tablet 0  . pantoprazole (PROTONIX) 40 MG tablet Take 40 mg by mouth daily before breakfast.    . potassium chloride SA (KLOR-CON) 20 MEQ tablet Take 20 mEq by mouth daily.    Marland Kitchen enoxaparin (LOVENOX) 40 MG/0.4ML injection Inject 0.4 mLs (40 mg total) into the skin daily. Starting Thursday, August 25. (Patient not taking: Reported on 06/10/2020) 12 mL 0  . hydroxypropyl methylcellulose (ISOPTO TEARS) 2.5 % ophthalmic solution Place 2 drops into both eyes 4 (four) times daily as needed (for dry eyes). (Patient not taking: Reported on 05/26/2020)     No current facility-administered medications for this visit.      Marland Kitchen  PHYSICAL EXAMINATION: ECOG PERFORMANCE STATUS: 0 - Asymptomatic  Vitals:   07/08/20 1049  BP: 129/72  Pulse: 71  Resp: 18  Temp: 98.4 F (36.9 C)  SpO2: 99%   Filed Weights   07/08/20 1049  Weight: 252 lb (114.3 kg)    Physical Exam HENT:     Head: Normocephalic and atraumatic.     Mouth/Throat:     Pharynx: No oropharyngeal exudate.  Eyes:     Pupils: Pupils are equal, round, and reactive to light.  Cardiovascular:     Rate and Rhythm: Normal rate and regular rhythm.  Pulmonary:     Effort: Pulmonary effort is normal. No respiratory distress.     Breath sounds: Normal breath sounds. No wheezing.  Abdominal:     General: Bowel sounds are normal. There is no distension.     Palpations: Abdomen is soft. There is  no mass.     Tenderness: There is no abdominal tenderness. There is no guarding or rebound.   Musculoskeletal:        General: No tenderness. Normal range of motion.     Cervical back: Normal range of motion and neck supple.  Skin:    General: Skin is warm.  Neurological:     Mental Status: She is alert and oriented to person, place, and time.  Psychiatric:        Mood and Affect: Affect normal.      LABORATORY DATA:  I have reviewed the data as listed Lab Results  Component Value Date   WBC 7.2 06/10/2020   HGB 10.1 (L) 06/10/2020   HCT 31.3 (L) 06/10/2020   MCV 86.9 06/10/2020   PLT 385 06/10/2020   Recent Labs    05/26/20 2251 06/10/20 1201  NA 137 139  K 3.8 4.2  CL 100 105  CO2 23 25  GLUCOSE 173* 107*  BUN 18 22  CREATININE 0.92 0.81  CALCIUM 9.2 9.2  GFRNONAA >60 >60  GFRAA >60 >60  PROT  --  7.2  ALBUMIN  --  3.7  AST  --  14*  ALT  --  11  ALKPHOS  --  75  BILITOT  --  0.3    RADIOGRAPHIC STUDIES: I have personally reviewed the radiological images as listed and agreed with the findings in the report. US RENAL  Result Date: 07/04/2020 CLINICAL DATA:  Left renal cyst EXAM: RENAL / URINARY TRACT ULTRASOUND COMPLETE COMPARISON:  Abdominal ultrasound 02/21/2012, CT 05/20/2020 FINDINGS: Right Kidney: Renal measurements: 9.5 x 5.5 x 5.2 cm = volume: 142.2 mL. Echogenicity is within normal limits. No concerning renal mass, shadowing calculus or hydronephrosis. Left Kidney: Renal measurements: 11.1 x 5.7 x 4.4 cm = volume: 145.6 mL. Echogenicity is within normal limits. The cystic lesion seen on the left kidney on comparison CT is not well visualized on the sonographic images likely related to technical factors. No concerning renal mass, shadowing calculus or hydronephrosis. Bladder: Appears normal for degree of bladder distention. Other: This is a technically challenging examination due to patient body habitus and difficulty with deep inspiration to improve sonographic windowing. IMPRESSION: Technically challenging exam results in suboptimal visualization  of the kidneys. The cystic lesion of interest on comparison CT is not well visualized sonographically. Further characterization of this lesion could be performed with multiphase renal CT or contrast-enhanced MRI with subtractions. Otherwise unremarkable renal ultrasound. Electronically Signed   By: Lovena Le M.D.   On: 07/04/2020 03:07    ASSESSMENT & PLAN:   Cancer of right colon ([place]) #Right-sided colon cancer-stage II; no significant high-risk features except for LVI; and the fact that tumor is MSI-STABLE.  Baseline signetera test- NEGATIVE for circulating tumor DNA.  Which is a good prognostic marker.  Reviewed the results with the patient.  No role for any adjuvant chemotherapy.  Continue surveillance with circulating tumor cells-DNA.  #We will continue to follow circulating tumor DNA-as per protocol.   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  Continue p.o. iron.  We will recheck labs at next visit.  # LEFT kidney cyst 1 cm in size; Ultrasound [PCP]-difficult assess because of body habitus.  Renal protocol CT versus MRI defer to PCP  # DISPOSITION: # in 2 month- labs- [facility] # in 4 months- labs- [facility] # in 8 months- MD; labs- cbc/cmp;[facility] -Dr.B  Cc; Dr.Johnston/Byrnett.   All questions  were answered. The patient knows to call the clinic with any problems, questions or concerns.    Cammie Sickle, MD 07/08/2020 3:23 PM

## 2020-07-08 NOTE — Assessment & Plan Note (Addendum)
#  Right-sided colon cancer-stage II; no significant high-risk features except for LVI; and the fact that tumor is MSI-STABLE.  Baseline signetera test- NEGATIVE for circulating tumor DNA.  Which is a good prognostic marker.  Reviewed the results with the patient.  No role for any adjuvant chemotherapy.  Continue surveillance with circulating tumor cells-DNA.  #We will continue to follow circulating tumor DNA-as per protocol.   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  Continue p.o. iron.  We will recheck labs at next visit.  # LEFT kidney cyst 1 cm in size; Ultrasound [PCP]-difficult assess because of body habitus.  Renal protocol CT versus MRI defer to PCP  # DISPOSITION: # in 2 month- labs- [facility] # in 4 months- labs- [facility] # in 8 months- MD; labs- cbc/cmp;[facility] -Dr.B  Cc; Dr.Johnston/Byrnett.

## 2020-07-09 ENCOUNTER — Other Ambulatory Visit: Payer: Self-pay | Admitting: Internal Medicine

## 2020-07-09 DIAGNOSIS — N281 Cyst of kidney, acquired: Secondary | ICD-10-CM

## 2020-07-15 ENCOUNTER — Ambulatory Visit
Admission: RE | Admit: 2020-07-15 | Discharge: 2020-07-15 | Disposition: A | Discharge status: Home / Self Care | Payer: Medicare Other | Source: Ambulatory Visit | Attending: Internal Medicine | Admitting: Internal Medicine

## 2020-07-15 ENCOUNTER — Other Ambulatory Visit: Payer: Self-pay

## 2020-07-15 DIAGNOSIS — Z1231 Encounter for screening mammogram for malignant neoplasm of breast: Secondary | ICD-10-CM | POA: Insufficient documentation

## 2020-07-16 ENCOUNTER — Telehealth: Payer: Self-pay

## 2020-07-16 ENCOUNTER — Encounter: Payer: Self-pay | Admitting: Internal Medicine

## 2020-07-16 NOTE — Telephone Encounter (Signed)
Spoke with patient regarding her sent out [employer] test. Informed her the results were negative for cancer per Dr. Jacinto Reap. Pt expressed understanding.

## 2020-07-17 ENCOUNTER — Ambulatory Visit
Admission: RE | Admit: 2020-07-17 | Discharge: 2020-07-17 | Disposition: A | Discharge status: Home / Self Care | Payer: Medicare Other | Source: Ambulatory Visit | Attending: Internal Medicine | Admitting: Internal Medicine

## 2020-07-17 ENCOUNTER — Other Ambulatory Visit: Payer: Self-pay

## 2020-07-17 DIAGNOSIS — N281 Cyst of kidney, acquired: Secondary | ICD-10-CM | POA: Insufficient documentation

## 2020-08-06 ENCOUNTER — Ambulatory Visit (INDEPENDENT_AMBULATORY_CARE_PROVIDER_SITE_OTHER): Payer: Medicare Other | Admitting: Urology

## 2020-08-06 ENCOUNTER — Other Ambulatory Visit: Payer: Self-pay

## 2020-08-06 ENCOUNTER — Encounter: Payer: Self-pay | Admitting: Urology

## 2020-08-06 DIAGNOSIS — N2889 Other specified disorders of kidney and ureter: Secondary | ICD-10-CM | POA: Diagnosis not present

## 2020-08-06 NOTE — Progress Notes (Signed)
08/06/2020 10:30 AM   Annette Mccullough [birth year] [id number]  Referring provider: Baxter Hire, MD [place],  [place] [address]  Chief Complaint  Patient presents with  . Other    HPI: 74 y.o. female seen at the request of Dr. Edwina Barth for evaluation of a small left renal mass.   Incidental finding of a 10 mm exophytic left upper pole mass on staging CT abdomen pelvis with contrast 05/20/2020  Renal ultrasound was recommended by radiology which was performed 07/02/2020 and the 10 mm lesion was not adequately visualized  A multiphase renal CT or MRI was recommended however a noncontrast CT was ordered which showed stable size however the mass could not be adequately characterize without contrast  Also 2-3 mm nonobstructing renal calculus left kidney noted on CT  Unable to have MRI secondary to spinal cord stimulator  Denies flank or abdominal pain  No bothersome LUTS  Patient states she has not noted to have blood in urine on urinalysis, denies gross hematuria  Recent UAs reviewed which show more pyuria than microhematuria and urine cultures positive for Enterococcus   PMH: Past Medical History:  Diagnosis Date  . Arthritis    multiple areas  . Blood transfusion    74- post childbirth & /w joint replacement   . Cancer ([place])   . Chronic kidney disease    renal calculi /w pregnancy  . Complication of anesthesia    post anesthesia - swelling of lip- relative to tape  . Family history of adverse reaction to anesthesia    sister- slow to wake up and " has died once "   . Fibromyalgia   . GERD (gastroesophageal reflux disease)   . Heart murmur    rec's antibiotic prior to dental work, never had any cardiac testing done  . History of kidney stones   . Hypertension   . Neuromuscular disorder ([place])    DDD- lumbar & cervical   . Psoriasis   . Seasonal allergies   . 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 ABDOMINAL HYSTERECTOMY    . APPENDECTOMY    . CERVICAL FUSION     2011  . CESAREAN SECTION     2 times   . COLONOSCOPY WITH PROPOFOL N/A 05/07/2020   Procedure: COLONOSCOPY WITH PROPOFOL;  Surgeon: Robert Bellow, MD;  Location: [facility] ENDOSCOPY;  Service: Endoscopy;  Laterality: N/A;  . CYST EXCISION Right    hand, and both ovaries  . ESOPHAGOGASTRODUODENOSCOPY N/A 05/07/2020   Procedure: ESOPHAGOGASTRODUODENOSCOPY (EGD);  Surgeon: Robert Bellow, MD;  Location: [facility] ENDOSCOPY;  Service: Endoscopy;  Laterality: N/A;  . EYE SURGERY     bilateral cataract surgery   . JOINT REPLACEMENT     1999- R knee  . LAPAROSCOPIC RIGHT COLECTOMY Right 05/26/2020   Procedure: LAPAROSCOPIC RIGHT HEMI-COLECTOMY;  Surgeon: Robert Bellow, MD;  Location: [facility] ORS;  Service: General;  Laterality: Right;  . LUMBAR WOUND DEBRIDEMENT  09/15/2011   Procedure: LUMBAR WOUND DEBRIDEMENT;  Surgeon: Dahlia Bailiff;  Location: [place];  Service: Orthopedics;  Laterality: N/A;  REPEAT I&D AND WOUND CLOSURE OF SPINAL WOUND  . OVARIAN CYST REMOVAL    . SPINAL CORD STIMULATOR BATTERY EXCHANGE Left 02/07/2013   Procedure: REMOVAL OF HARDWARE (BATTERY REMOVAL);  Surgeon: Melina Schools, MD;  Location: [place];  Service: Orthopedics;  Laterality: Left;  removal of spinal cord stimulator battery  . SPINAL CORD STIMULATOR INSERTION  08/25/2011   Procedure: LUMBAR SPINAL CORD STIMULATOR INSERTION;  Surgeon: Dahlia Bailiff;  Location: [place];  Service: Orthopedics;  Laterality: N/A;  . TONSILLECTOMY     as a child  . TOTAL KNEE REVISION Right 01/21/2016   Procedure: RIGHT TOTAL KNEE REVISION;  Surgeon: Gaynelle Arabian, MD;  Location: [facility] ORS;  Service: Orthopedics;  Laterality: Right;  . TRIGGER FINGER RELEASE Right    thumb    Home Medications:  Allergies as of 08/06/2020      Reactions   Eggs Or Egg-derived Products Other (See Comments)   Pt.  States egg yoke causes abdominal discomfort if she eats fresh eggs   Tape Swelling   Blisters, paper tape is ok      Medication List       Accurate as of August 06, 2020 10:30 AM. If you have any questions, ask your nurse or doctor.        aspirin EC 81 MG tablet Take 81 mg by mouth daily. Swallow whole.   cyclobenzaprine 10 MG tablet Commonly known as: FLEXERIL Take 10 mg by mouth 3 (three) times daily as needed for muscle spasms.   diclofenac 75 MG EC tablet Commonly known as: VOLTAREN Take 1 tablet (75 mg total) by mouth 2 (two) times daily as needed.   diphenhydrAMINE 25 MG tablet Commonly known as: BENADRYL Take 25 mg by mouth daily as needed for allergies.   diphenhydrAMINE 50 MG tablet Commonly known as: BENADRYL Take 50 mg by mouth at bedtime as needed for sleep.   docusate sodium 100 MG capsule Commonly known as: COLACE Take 100 mg by mouth 2 (two) times daily.   DULoxetine 60 MG capsule Commonly known as: CYMBALTA Take 60 mg by mouth daily.   enoxaparin 40 MG/0.4ML injection Commonly known as: LOVENOX Inject 0.4 mLs (40 mg total) into the skin daily. Starting Thursday, August 25.   ferrous sulfate 325 (65 FE) MG tablet Take 325 mg by mouth daily. Take with 4 oz of orange juice   hydrochlorothiazide 25 MG tablet Commonly known as: HYDRODIURIL Take 1 tablet (25 mg total) by mouth every morning.   hydroxypropyl methylcellulose / hypromellose 2.5 % ophthalmic solution Commonly known as: ISOPTO TEARS / GONIOVISC Place 2 drops into both eyes 4 (four) times daily as needed (for dry eyes).   lisinopril 20 MG tablet Commonly known as: ZESTRIL Take 1 tablet (20 mg total) by mouth daily.   metoprolol tartrate 25 MG tablet Commonly known as: LOPRESSOR Take 25 mg by mouth daily.   oxyCODONE-acetaminophen 10-325 MG tablet Commonly known as: PERCOCET Take 1 tablet by mouth 4 (four) times daily.   oxyCODONE-acetaminophen 10-325 MG tablet Commonly known  as: Percocet Take 1 tablet by mouth every 6 (six) hours as needed for pain.   pantoprazole 40 MG tablet Commonly known as: PROTONIX Take 40 mg by mouth daily before breakfast.   potassium chloride SA 20 MEQ tablet Commonly known as: KLOR-CON Take 20 mEq by mouth daily.       Allergies:  Allergies  Allergen Reactions  . Eggs Or Egg-Derived Products Other (See Comments)    Pt. States egg yoke causes abdominal discomfort if she eats fresh eggs  . Tape Swelling    Blisters, paper tape is ok    Family History: Family History  Problem Relation Age of Onset  . Anesthesia problems Sister   . Breast  cancer Sister 54  . Breast cancer Mother 4  . Breast cancer Maternal Grandmother 60    Social History:  reports that she quit smoking about 15 years ago. She has never used smokeless tobacco. She reports current alcohol use. She reports that she does not use drugs.   Physical Exam: BP 123/83   Pulse 83   Ht 5\' 7"  (1.702 m)   Wt 250 lb (113.4 kg)   BMI 39.16 kg/m   Constitutional:  Alert and oriented, No acute distress. HEENT: [place] AT, moist mucus membranes.  Trachea midline, no masses. Cardiovascular: No clubbing, cyanosis, or edema. Respiratory: Normal respiratory effort, no increased work of breathing. Skin: No rashes, bruises or suspicious lesions. Neurologic: Grossly intact, no focal deficits, moving all 4 extremities. Psychiatric: Normal mood and affect.   Pertinent Imaging: CT images personally reviewed  CT RENAL STONE STUDY  Narrative CLINICAL DATA:  Follow-up indeterminate left renal lesion.  EXAM: CT ABDOMEN AND PELVIS WITHOUT CONTRAST  TECHNIQUE: Multidetector CT imaging of the abdomen and pelvis was performed following the standard protocol without IV contrast.  COMPARISON:  05/20/2020  FINDINGS: Lower chest: No acute findings.  Hepatobiliary: No mass visualized on this unenhanced exam. Gallbladder is unremarkable. No evidence of biliary  ductal dilatation.  Pancreas: No mass or inflammatory process visualized on this unenhanced exam.  Spleen:  Within normal limits in size.  Adrenals/Urinary tract: 3 mm calculus noted in posterior midpole of left kidney. No evidence of ureteral calculi or hydronephrosis. A subcapsular high attenuation lesion is again seen in the lateral upper pole of the left kidney which measures 10 mm. This shows no significant change in size since recent study but cannot be characterized on this unenhanced exam.  Stomach/Bowel: Postop changes from previous right colectomy. No evidence of obstruction, inflammatory process, or abnormal fluid collections. Diverticulosis is seen mainly involving the descending and sigmoid colon, however there is no evidence of diverticulitis.  Vascular/Lymphatic: No pathologically enlarged lymph nodes identified. No evidence of abdominal aortic aneurysm. Aortic atherosclerotic calcification noted.  Reproductive: Prior hysterectomy noted. Adnexal regions are unremarkable in appearance.  Other:  None.  Musculoskeletal: No suspicious bone lesions identified. Neurostimulator leads noted in the lower thoracic spinal canal.  : Persistent 10 mm subcapsular high attenuation lesion in upper pole of the left kidney is indeterminate, and cannot be characterized on this unenhanced exam. Abdomen MRI without and with contrast is recommended for further characterization.  3 mm left renal calculus. No evidence of ureteral calculi or hydronephrosis.  Colonic diverticulosis. No radiographic evidence of diverticulitis.  Aortic Atherosclerosis (ICD10-I70.0).   Electronically Signed By: Marlaine Hind M.D. On: 07/18/2020 16:09   Assessment & Plan:    1.  Indeterminate left renal mass  Undiagnosed new problem with uncertain prognosis  Most likely simple versus hyperdense benign renal cyst A solid renal mass raises the suspicion of primary renal malignancy.  We  discussed this in detail and in regards to the spectrum of renal masses which includes cysts (pure cysts are considered benign), solid masses and everything in between. The risk of metastasis increases as the size of solid renal mass increases. In general, it is believed that the risk of metastasis for renal masses less than 3-4 cm is small (up to approximately 5%) based mainly on large retrospective studies.  Has not had renal mass protocol imaging and unable to have MRI secondary to spinal cord stimulator  Recommend scheduling renal mass protocol CT abdomen with and without contrast in 6 months.  Due to small size the mass may still not be able to be adequately characterized on renal mass protocol CT  2.  Asymptomatic bacteriuria  Urinalysis ordered today    Abbie Sons, [place] [address] [place], [place] [address] [phone]

## 2020-08-06 NOTE — Addendum Note (Signed)
Addended by: Chrystie Nose on: 08/06/2020 11:33 AM   Modules accepted: Orders

## 2020-08-08 LAB — URINALYSIS, COMPLETE
Bilirubin, UA: NEGATIVE
Glucose, UA: NEGATIVE
Nitrite, UA: NEGATIVE
Specific Gravity, UA: 1.025 (ref 1.005–1.030)
Urobilinogen, Ur: 1 mg/dL (ref 0.2–1.0)
pH, UA: 6 (ref 5.0–7.5)

## 2020-08-08 LAB — MICROSCOPIC EXAMINATION
Epithelial Cells (non renal): >10 /hpf — AB (ref 0–10)
WBC, UA: >30 /hpf — AB (ref 0–5)

## 2020-08-10 LAB — CULTURE, URINE COMPREHENSIVE

## 2020-09-09 ENCOUNTER — Inpatient Hospital Stay: Payer: Medicare Other | Attending: Internal Medicine

## 2020-09-09 ENCOUNTER — Other Ambulatory Visit: Payer: Self-pay

## 2020-09-15 ENCOUNTER — Encounter: Payer: Self-pay | Admitting: Internal Medicine

## 2020-11-11 ENCOUNTER — Inpatient Hospital Stay: Payer: Medicare Other | Attending: Internal Medicine

## 2020-11-27 ENCOUNTER — Telehealth: Payer: Self-pay | Admitting: *Deleted

## 2020-11-27 ENCOUNTER — Encounter: Payer: Self-pay | Admitting: Internal Medicine

## 2020-11-27 NOTE — Telephone Encounter (Signed)
Contacted patient- signatera results reviewed with the patient. Results are negative. She will continue to follow-up with Dr. Rogue Bussing in June.-

## 2021-01-12 ENCOUNTER — Other Ambulatory Visit: Payer: Self-pay

## 2021-01-12 ENCOUNTER — Inpatient Hospital Stay: Payer: Medicare Other | Attending: Internal Medicine

## 2021-01-20 ENCOUNTER — Encounter: Payer: Self-pay | Admitting: Internal Medicine

## 2021-01-22 ENCOUNTER — Telehealth: Payer: Self-pay | Admitting: *Deleted

## 2021-01-22 NOTE — Telephone Encounter (Signed)
Attempted to reach patient. Left vm for patient

## 2021-01-22 NOTE — Telephone Encounter (Signed)
-----   Message from Cammie Sickle, MD sent at 01/21/2021  9:39 PM EDT ----- H/T- please inform pt that the results of the lab work/tumor maker is negative- follow up as planned.  GB

## 2021-02-04 ENCOUNTER — Ambulatory Visit: Payer: Medicare Other | Admitting: Urology

## 2021-03-10 ENCOUNTER — Encounter: Payer: Self-pay | Admitting: Internal Medicine

## 2021-03-10 ENCOUNTER — Inpatient Hospital Stay: Payer: Medicare Other | Attending: Internal Medicine

## 2021-03-10 ENCOUNTER — Inpatient Hospital Stay (HOSPITAL_BASED_OUTPATIENT_CLINIC_OR_DEPARTMENT_OTHER): Payer: Medicare Other | Admitting: Internal Medicine

## 2021-03-10 DIAGNOSIS — Z87891 Personal history of nicotine dependence: Secondary | ICD-10-CM | POA: Diagnosis not present

## 2021-03-10 DIAGNOSIS — D509 Iron deficiency anemia, unspecified: Secondary | ICD-10-CM | POA: Diagnosis not present

## 2021-03-10 DIAGNOSIS — C182 Malignant neoplasm of ascending colon: Secondary | ICD-10-CM | POA: Diagnosis not present

## 2021-03-10 LAB — CBC WITH DIFFERENTIAL/PLATELET
Abs Immature Granulocytes: 0.04 10*3/uL (ref 0.00–0.07)
Basophils Absolute: 0.1 10*3/uL (ref 0.0–0.1)
Basophils Relative: 1 %
Eosinophils Absolute: 0.2 10*3/uL (ref 0.0–0.5)
Eosinophils Relative: 3 %
HCT: 39.6 % (ref 36.0–46.0)
Hemoglobin: 13 g/dL (ref 12.0–15.0)
Immature Granulocytes: 0 %
Lymphocytes Relative: 15 %
Lymphs Abs: 1.4 10*3/uL (ref 0.7–4.0)
MCH: 31.2 pg (ref 26.0–34.0)
MCHC: 32.8 g/dL (ref 30.0–36.0)
MCV: 95 fL (ref 80.0–100.0)
Monocytes Absolute: 0.5 10*3/uL (ref 0.1–1.0)
Monocytes Relative: 5 %
Neutro Abs: 7.1 10*3/uL (ref 1.7–7.7)
Neutrophils Relative %: 76 %
Platelets: 283 10*3/uL (ref 150–400)
RBC: 4.17 MIL/uL (ref 3.87–5.11)
RDW: 13.9 % (ref 11.5–15.5)
WBC: 9.3 10*3/uL (ref 4.0–10.5)
nRBC: 0 % (ref 0.0–0.2)

## 2021-03-10 LAB — COMPREHENSIVE METABOLIC PANEL
ALT: 11 U/L (ref 0–44)
AST: 18 U/L (ref 15–41)
Albumin: 3.6 g/dL (ref 3.5–5.0)
Alkaline Phosphatase: 68 U/L (ref 38–126)
Anion gap: 11 (ref 5–15)
BUN: 23 mg/dL (ref 8–23)
CO2: 24 mmol/L (ref 22–32)
Calcium: 9.4 mg/dL (ref 8.9–10.3)
Chloride: 102 mmol/L (ref 98–111)
Creatinine, Ser: 0.84 mg/dL (ref 0.44–1.00)
GFR, Estimated: >60 mL/min (ref >60)
Glucose, Bld: 120 mg/dL — ABNORMAL HIGH (ref 70–99)
Potassium: 4 mmol/L (ref 3.5–5.1)
Sodium: 137 mmol/L (ref 135–145)
Total Bilirubin: 0.4 mg/dL (ref 0.3–1.2)
Total Protein: 7.1 g/dL (ref 6.5–8.1)

## 2021-03-10 NOTE — Progress Notes (Signed)
[place] NOTE  Patient Care Team: Baxter Hire, MD as PCP - General (Internal Medicine) Cammie Sickle, MD as Consulting Physician (Internal Medicine) Bary Castilla, [place] Gleason, MD as Consulting Physician (General Surgery) Clent Jacks, RN as Oncology Nurse Navigator  CHIEF COMPLAINTS/PURPOSE OF CONSULTATION: COLON CANCER  #  Oncology History Overview Note  # AUG 2021-Colon cancer -s/p right hemicolectomy; Dr. Tollie Pizza; moderately differentiated; pT3;pN0-0/31 LN; STage II [no tumor perforation; negative margins; moderate differentiated; positive for LVI; MSI stable]; signatera- SEP 2021- NEGATIVE FOR CT-DNA  #No adjuvant therapy   # Chronic back pain [pain clinic- on oxycodone]; PN- ? Chronic back pain.   # SURVIVORSHIP:   # GENETICS: NONE  DIAGNOSIS: colon cancer- right  STAGE:   II      ;  GOALS: cure  CURRENT/MOST RECENT THERAPY : Surveillance   Cancer of right colon ([place])  05/26/2020 Initial Diagnosis   Cancer of right colon ([place])      HISTORY OF PRESENTING ILLNESS:  Annette Mccullough 74 y.o.  female patient with above diagnosis of stage II right-sided colon cancer is here for follow-up/review the results.  Patient has had Signatera testing in the interim-negative.  Patient denies any blood in stools or black-colored stools.  No abdominal pain no nausea no vomiting.  She is currently on p.o. iron   Review of Systems  Constitutional: Negative for chills, diaphoresis, fever and weight loss.  HENT: Negative for nosebleeds and sore throat.   Eyes: Negative for double vision.  Respiratory: Negative for cough, hemoptysis, sputum production, shortness of breath and wheezing.   Cardiovascular: Negative for chest pain, palpitations, orthopnea and leg swelling.  Gastrointestinal: Negative for abdominal pain, blood in stool, constipation, diarrhea, heartburn, melena, nausea and vomiting.  Genitourinary: Negative for dysuria, frequency and  urgency.  Musculoskeletal: Positive for back pain and joint pain.  Skin: Negative.  Negative for itching and rash.  Neurological: Positive for tingling. Negative for dizziness, focal weakness, weakness and headaches.  Endo/Heme/Allergies: Does not bruise/bleed easily.  Psychiatric/Behavioral: Negative for depression. The patient is not nervous/anxious and does not have insomnia.      MEDICAL HISTORY:  Past Medical History:  Diagnosis Date  . Arthritis    multiple areas  . Blood transfusion    74- post childbirth & /w joint replacement   . Cancer ([place])   . Chronic kidney disease    renal calculi /w pregnancy  . Complication of anesthesia    post anesthesia - swelling of lip- relative to tape  . Family history of adverse reaction to anesthesia    sister- slow to wake up and " has died once "   . Fibromyalgia   . GERD (gastroesophageal reflux disease)   . Heart murmur    rec's antibiotic prior to dental work, never had any cardiac testing done  . History of kidney stones   . Hypertension   . Neuromuscular disorder ([place])    DDD- lumbar & cervical   . Psoriasis   . Seasonal allergies   . 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 ABDOMINAL HYSTERECTOMY    . APPENDECTOMY    . CERVICAL FUSION     2011  . CESAREAN SECTION     2 times   . COLONOSCOPY WITH PROPOFOL N/A 05/07/2020   Procedure: COLONOSCOPY WITH PROPOFOL;  Surgeon: Hervey Ard  W, MD;  Location: [facility] ENDOSCOPY;  Service: Endoscopy;  Laterality: N/A;  . CYST EXCISION Right    hand, and both ovaries  . ESOPHAGOGASTRODUODENOSCOPY N/A 05/07/2020   Procedure: ESOPHAGOGASTRODUODENOSCOPY (EGD);  Surgeon: Robert Bellow, MD;  Location: [facility] ENDOSCOPY;  Service: Endoscopy;  Laterality: N/A;  . EYE SURGERY     bilateral cataract surgery   . JOINT REPLACEMENT     1999- R knee  . LAPAROSCOPIC RIGHT COLECTOMY Right  05/26/2020   Procedure: LAPAROSCOPIC RIGHT HEMI-COLECTOMY;  Surgeon: Robert Bellow, MD;  Location: [facility] ORS;  Service: General;  Laterality: Right;  . LUMBAR WOUND DEBRIDEMENT  09/15/2011   Procedure: LUMBAR WOUND DEBRIDEMENT;  Surgeon: Dahlia Bailiff;  Location: [place];  Service: Orthopedics;  Laterality: N/A;  REPEAT I&D AND WOUND CLOSURE OF SPINAL WOUND  . OVARIAN CYST REMOVAL    . SPINAL CORD STIMULATOR BATTERY EXCHANGE Left 02/07/2013   Procedure: REMOVAL OF HARDWARE (BATTERY REMOVAL);  Surgeon: Melina Schools, MD;  Location: [place];  Service: Orthopedics;  Laterality: Left;  removal of spinal cord stimulator battery  . SPINAL CORD STIMULATOR INSERTION  08/25/2011   Procedure: LUMBAR SPINAL CORD STIMULATOR INSERTION;  Surgeon: Dahlia Bailiff;  Location: [place];  Service: Orthopedics;  Laterality: N/A;  . TONSILLECTOMY     as a child  . TOTAL KNEE REVISION Right 01/21/2016   Procedure: RIGHT TOTAL KNEE REVISION;  Surgeon: Gaynelle Arabian, MD;  [facility] ORS;  Service: Orthopedics;  Laterality: Right;  . TRIGGER FINGER RELEASE Right    thumb    SOCIAL HISTORY: Social History   Socioeconomic History  . Marital status: Widowed    Spouse name: Not on file  . Number of children: Not on file  . Years of education: Not on file  . Highest education level: Not on file  Occupational History  . Not on file  Tobacco Use  . Smoking status: Former Smoker    Quit date: 08/17/2004    Years since quitting: 16.5  . Smokeless tobacco: Never Used  Vaping Use  . Vaping Use: Never used  Substance and Sexual Activity  . Alcohol use: Yes    Comment: rare  . Drug use: No  . Sexual activity: Not on file  Other Topics Concern  . 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    Social Determinants of Health   Financial Resource Strain: Not on file  Food Insecurity: Not on file  Transportation  Needs: Not on file  Physical Activity: Not on file  Stress: Not on file  Social Connections: Not on file  Intimate Partner Violence: Not on file    FAMILY HISTORY: Family History  Problem Relation Age of Onset  . Anesthesia problems Sister   . Breast cancer Sister 43  . Breast cancer Mother 38  . Breast cancer Maternal Grandmother 60    ALLERGIES:  is allergic to eggs or egg-derived products and tape.  MEDICATIONS:  Current Outpatient Medications  Medication Sig Dispense Refill  . aspirin EC 81 MG tablet Take 81 mg by mouth daily. Swallow whole.    . cyclobenzaprine (FLEXERIL) 10 MG tablet Take 10 mg by mouth 3 (three) times daily as needed for muscle spasms.    Marland Kitchen docusate sodium (COLACE) 100 MG capsule Take 100 mg by mouth 2 (two) times daily.     . DULoxetine (CYMBALTA) 60 MG capsule Take  60 mg by mouth daily.    . ferrous sulfate 325 (65 FE) MG tablet Take 325 mg by mouth daily. Take with 4 oz of orange juice    . hydrochlorothiazide (HYDRODIURIL) 25 MG tablet Take 1 tablet (25 mg total) by mouth every morning. 1 tablet 1  . hydroxypropyl methylcellulose (ISOPTO TEARS) 2.5 % ophthalmic solution Place 2 drops into both eyes 4 (four) times daily as needed (for dry eyes).     Marland Kitchen lisinopril (ZESTRIL) 20 MG tablet Take 1 tablet (20 mg total) by mouth daily. 1 tablet 1  . metoprolol tartrate (LOPRESSOR) 25 MG tablet Take 25 mg by mouth daily.    Marland Kitchen oxyCODONE-acetaminophen (PERCOCET) 10-325 MG tablet Take 1 tablet by mouth 4 (four) times daily.    . pantoprazole (PROTONIX) 40 MG tablet Take 40 mg by mouth daily before breakfast.    . potassium chloride SA (KLOR-CON) 20 MEQ tablet Take 20 mEq by mouth daily.    . diphenhydrAMINE (BENADRYL) 25 MG tablet Take 25 mg by mouth daily as needed for allergies. (Patient not taking: Reported on 03/10/2021)     No current facility-administered medications for this visit.      Marland Kitchen  PHYSICAL EXAMINATION: ECOG PERFORMANCE STATUS: 0 -  Asymptomatic  Vitals:   03/10/21 1044  BP: 126/76  Pulse: 72  Resp: 18  Temp: 97.6 F (36.4 C)  SpO2: 97%   Filed Weights   03/10/21 1044  Weight: 261 lb (118.4 kg)    Physical Exam HENT:     Head: Normocephalic and atraumatic.     Mouth/Throat:     Pharynx: No oropharyngeal exudate.  Eyes:     Pupils: Pupils are equal, round, and reactive to light.  Cardiovascular:     Rate and Rhythm: Normal rate and regular rhythm.  Pulmonary:     Effort: Pulmonary effort is normal. No respiratory distress.     Breath sounds: Normal breath sounds. No wheezing.  Abdominal:     General: Bowel sounds are normal. There is no distension.     Palpations: Abdomen is soft. There is no mass.     Tenderness: There is no abdominal tenderness. There is no guarding or rebound.  Musculoskeletal:        General: No tenderness. Normal range of motion.     Cervical back: Normal range of motion and neck supple.  Skin:    General: Skin is warm.  Neurological:     Mental Status: She is alert and oriented to person, place, and time.  Psychiatric:        Mood and Affect: Affect normal.      LABORATORY DATA:  I have reviewed the data as listed Lab Results  Component Value Date   WBC 9.3 03/10/2021   HGB 13.0 03/10/2021   HCT 39.6 03/10/2021   MCV 95.0 03/10/2021   PLT 283 03/10/2021   Recent Labs    05/26/20 2251 06/10/20 1201 03/10/21 0945  NA 137 139 137  K 3.8 4.2 4.0  CL 100 105 102  CO2 23 25 24   GLUCOSE 173* 107* 120*  BUN 18 22 23   CREATININE 0.92 0.81 0.84  CALCIUM 9.2 9.2 9.4  GFRNONAA >60 >60 >60  GFRAA >60 >60  --   PROT  --  7.2 7.1  ALBUMIN  --  3.7 3.6  AST  --  14* 18  ALT  --  11 11  ALKPHOS  --  75 68  BILITOT  --  0.3  0.4    RADIOGRAPHIC STUDIES: I have personally reviewed the radiological images as listed and agreed with the findings in the report. No results found.  ASSESSMENT & PLAN:   Cancer of right colon ([place]) # Right-sided colon cancer-stage  II; positive for LVI; s MSI-STABLE.  Baseline signetera test- NEGATIVE for circulating tumor DNA.  No adjuvant chemotherapy  #Signatera testing negative in the interim.  S/p testing this morning pending.  We will check regarding the protocol for testing.  Continue surveillance colonoscopy in August 2022.    #Iron deficient anemia-hemoglobin 9-10; secondary Hx of colon ca; today Hb 13; iron pills TIW;  . * check protocl for signetera  # DISPOSITION: # in 6 months- MD; labs- cbc/cmp;CEA -Dr.B  Cc; Dr.Johnston/Byrnett.   All questions were answered. The patient knows to call the clinic with any problems, questions or concerns.    Cammie Sickle, MD 03/10/2021 11:16 AM

## 2021-03-10 NOTE — Assessment & Plan Note (Addendum)
#  Right-sided colon cancer-stage II; positive for LVI; s MSI-STABLE.  Baseline signetera test- NEGATIVE for circulating tumor DNA.  No adjuvant chemotherapy  #Signatera testing negative in the interim.  S/p testing this morning pending.  We will check regarding the protocol for testing.  Continue surveillance colonoscopy in August 2022.    #Iron deficient anemia-hemoglobin 9-10; secondary Hx of colon ca; today Hb 13; iron pills TIW;  . * check protocl for signetera  # DISPOSITION: # in 6 months- MD; labs- cbc/cmp;CEA -Dr.B  Cc; Dr.Johnston/Byrnett.

## 2021-03-10 NOTE — Progress Notes (Signed)
Pt in for follow up, reports pain in lower legs and back.  Pt also has some lower extremity swelling.

## 2021-03-16 ENCOUNTER — Telehealth: Payer: Self-pay

## 2021-03-16 NOTE — Telephone Encounter (Signed)
Order was printed Friday 6/10, signed and faxed back. They received the order. She sent a confirmation email.

## 2021-03-16 NOTE — Telephone Encounter (Signed)
-----   Message from Gloris Ham, RN sent at 03/16/2021  9:27 AM EDT ----- Regarding: RE: signtera protocol Lovena Le - Apparently the requisition wasn't signed prior to blood draw.  The company had sent it to my email while I was gone. I had dr. B sign it this morning. So hopefully this will resolve it. =Heather ----- Message ----- From: Cammie Sickle, MD Sent: 03/10/2021  11:18 AM EDT To: Lorrin Jackson, CMA, Gloris Ham, RN Subject: signtera protocol                              Heather- not urgent. But please check on pt's next singetra testing. Thanks

## 2021-03-24 ENCOUNTER — Other Ambulatory Visit: Payer: Self-pay | Admitting: Family Medicine

## 2021-03-24 ENCOUNTER — Other Ambulatory Visit (HOSPITAL_COMMUNITY): Payer: Self-pay | Admitting: Family Medicine

## 2021-03-24 DIAGNOSIS — M545 Low back pain, unspecified: Secondary | ICD-10-CM

## 2021-03-24 DIAGNOSIS — M79609 Pain in unspecified limb: Secondary | ICD-10-CM

## 2021-03-25 ENCOUNTER — Encounter: Payer: Self-pay | Admitting: Internal Medicine

## 2021-03-25 ENCOUNTER — Telehealth: Payer: Self-pay | Admitting: *Deleted

## 2021-03-25 NOTE — Progress Notes (Signed)
Patient called.    Spoke with patient. Patient aware that her signatera was normal and did not show any signs of cancer. She was instructed to keep her followpup apts as scheduled. She will be contacted when her next blood draw for signatera will be due.

## 2021-03-25 NOTE — Telephone Encounter (Signed)
Spoke with patient. Patient aware that her signatera was normal and did not show any signs of cancer. She was instructed to keep her followpup apts as scheduled. She will be contacted when her next blood draw for signatera will be due.

## 2021-03-25 NOTE — Telephone Encounter (Signed)
please inform patient that blood test does not show any evidence of cancer. Follow-up as planned.  No new recommendations.

## 2021-03-26 NOTE — Telephone Encounter (Signed)
Next signatera apt is due 9/7

## 2021-03-30 NOTE — Telephone Encounter (Signed)
Spoke with patient. Apts made

## 2021-04-07 ENCOUNTER — Ambulatory Visit
Admission: RE | Admit: 2021-04-07 | Discharge: 2021-04-07 | Disposition: A | Discharge status: Home / Self Care | Payer: Medicare Other | Source: Ambulatory Visit | Attending: Urology | Admitting: Urology

## 2021-04-07 ENCOUNTER — Other Ambulatory Visit: Payer: Self-pay

## 2021-04-07 DIAGNOSIS — N2889 Other specified disorders of kidney and ureter: Secondary | ICD-10-CM | POA: Diagnosis not present

## 2021-04-07 MED ORDER — IOHEXOL 300 MG/ML  SOLN
100.0000 mL | Freq: Once | INTRAMUSCULAR | Status: AC | PRN
  Administered 2021-04-07: 100 mL via INTRAVENOUS

## 2021-04-08 ENCOUNTER — Ambulatory Visit: Payer: Medicare Other

## 2021-04-09 ENCOUNTER — Telehealth: Payer: Self-pay | Admitting: *Deleted

## 2021-04-09 NOTE — Telephone Encounter (Signed)
Notified patient as instructed, patient pleased. D

## 2021-04-09 NOTE — Telephone Encounter (Signed)
-----   Message from Abbie Sons, MD sent at 04/09/2021  7:10 AM EDT ----- Renal mass CT shows a benign renal cyst.  No further imaging or follow-up is needed

## 2021-05-01 ENCOUNTER — Other Ambulatory Visit: Payer: Self-pay | Admitting: General Surgery
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
## 2021-05-27 ENCOUNTER — Ambulatory Visit: Payer: Medicare Other | Admitting: Registered Nurse

## 2021-05-27 ENCOUNTER — Encounter: Payer: Self-pay | Admitting: General Surgery

## 2021-05-27 ENCOUNTER — Ambulatory Visit
Admission: RE | Admit: 2021-05-27 | Discharge: 2021-05-27 | Disposition: A | Discharge status: Home / Self Care | Payer: Medicare Other | Attending: General Surgery | Admitting: General Surgery

## 2021-05-27 ENCOUNTER — Encounter
Admission: RE | Disposition: A | Discharge status: Home / Self Care | Payer: Self-pay | Source: Home / Self Care | Attending: General Surgery

## 2021-05-27 DIAGNOSIS — D124 Benign neoplasm of descending colon: Secondary | ICD-10-CM | POA: Diagnosis not present

## 2021-05-27 DIAGNOSIS — Z87891 Personal history of nicotine dependence: Secondary | ICD-10-CM | POA: Insufficient documentation

## 2021-05-27 DIAGNOSIS — K5730 Diverticulosis of large intestine without perforation or abscess without bleeding: Secondary | ICD-10-CM | POA: Diagnosis not present

## 2021-05-27 DIAGNOSIS — Z1211 Encounter for screening for malignant neoplasm of colon: Secondary | ICD-10-CM | POA: Insufficient documentation

## 2021-05-27 DIAGNOSIS — Z85038 Personal history of other malignant neoplasm of large intestine: Secondary | ICD-10-CM | POA: Insufficient documentation

## 2021-05-27 DIAGNOSIS — N189 Chronic kidney disease, unspecified: Secondary | ICD-10-CM | POA: Insufficient documentation

## 2021-05-27 DIAGNOSIS — Z888 Allergy status to other drugs, medicaments and biological substances status: Secondary | ICD-10-CM | POA: Diagnosis not present

## 2021-05-27 DIAGNOSIS — I129 Hypertensive chronic kidney disease with stage 1 through stage 4 chronic kidney disease, or unspecified chronic kidney disease: Secondary | ICD-10-CM | POA: Insufficient documentation

## 2021-05-27 DIAGNOSIS — Z91048 Other nonmedicinal substance allergy status: Secondary | ICD-10-CM | POA: Diagnosis not present

## 2021-05-27 DIAGNOSIS — Z7982 Long term (current) use of aspirin: Secondary | ICD-10-CM | POA: Insufficient documentation

## 2021-05-27 DIAGNOSIS — Z79899 Other long term (current) drug therapy: Secondary | ICD-10-CM | POA: Insufficient documentation

## 2021-05-27 HISTORY — PX: COLONOSCOPY WITH PROPOFOL: SHX5780

## 2021-05-27 SURGERY — COLONOSCOPY WITH PROPOFOL
Anesthesia: General

## 2021-05-27 MED ORDER — SODIUM CHLORIDE 0.9 % IV SOLN
INTRAVENOUS | Status: DC
  Administered 2021-05-27: 1000 mL via INTRAVENOUS

## 2021-05-27 MED ORDER — LIDOCAINE HCL (PF) 2 % IJ SOLN
INTRAMUSCULAR | Status: AC
  Filled 2021-05-27: qty 5

## 2021-05-27 MED ORDER — PHENYLEPHRINE HCL (PRESSORS) 10 MG/ML IV SOLN
INTRAVENOUS | Status: DC | PRN
  Administered 2021-05-27: 100 ug via INTRAVENOUS

## 2021-05-27 MED ORDER — LIDOCAINE HCL (CARDIAC) PF 100 MG/5ML IV SOSY
PREFILLED_SYRINGE | INTRAVENOUS | Status: DC | PRN
  Administered 2021-05-27: 40 mg via INTRAVENOUS

## 2021-05-27 MED ORDER — PROPOFOL 10 MG/ML IV BOLUS
INTRAVENOUS | Status: DC | PRN
  Administered 2021-05-27: 80 mg via INTRAVENOUS

## 2021-05-27 MED ORDER — PROPOFOL 500 MG/50ML IV EMUL
INTRAVENOUS | Status: AC
  Filled 2021-05-27: qty 50

## 2021-05-27 MED ORDER — EPHEDRINE 5 MG/ML INJ
INTRAVENOUS | Status: AC
  Filled 2021-05-27: qty 5

## 2021-05-27 MED ORDER — PROPOFOL 500 MG/50ML IV EMUL
INTRAVENOUS | Status: DC | PRN
  Administered 2021-05-27: 150 ug/kg/min via INTRAVENOUS

## 2021-05-27 NOTE — Transfer of Care (Signed)
Immediate Anesthesia Transfer of Care Note  Patient: Annette Mccullough  Procedure(s) Performed: Procedure(s): COLONOSCOPY WITH PROPOFOL (N/A)  Patient Location: PACU and Endoscopy Unit  Anesthesia Type:General  Level of Consciousness: sedated  Airway & Oxygen Therapy: Patient Spontanous Breathing and Patient connected to nasal cannula oxygen  Post-op Assessment: Report given to RN and Post -op Vital signs reviewed and stable  Post vital signs: Reviewed and stable  Last Vitals:  Vitals:   05/27/21 0901 05/27/21 1015  BP: (!) 155/104 116/66  Pulse: 72 67  Resp: 20   Temp: (!) 36.1 C   SpO2: [id number]    Complications: No apparent anesthesia complications

## 2021-05-27 NOTE — Op Note (Signed)
[facility] Patient Name: Annette Mccullough Procedure Date: 05/27/2021 9:17 AM MRN: [id number] Account #: [id number] Date of Birth: [birth year] Admit Type: Outpatient Age: 75 Room: [facility] ENDO ROOM 1 Gender: Female Note Status: Finalized Procedure:             Colonoscopy Indications:           High risk colon cancer surveillance: Personal history                         of colon cancer Providers:             Robert Bellow, MD Medicines:             Propofol per Anesthesia Complications:         No immediate complications. Procedure:             Pre-Anesthesia Assessment:                        - Prior to the procedure, a History and Physical was                         performed, and patient medications, allergies and                         sensitivities were reviewed. The patient's tolerance                         of previous anesthesia was reviewed.                        - The risks and benefits of the procedure and the                         sedation options and risks were discussed with the                         patient. All questions were answered and informed                         consent was obtained.                        After obtaining informed consent, the colonoscope was                         passed under direct vision. Throughout the procedure,                         the patient's blood pressure, pulse, and oxygen                         saturations were monitored continuously. The                         Colonoscope was introduced through the anus and                         advanced to the the cecum, identified by appendiceal  orifice and ileocecal valve. The colonoscopy was                         somewhat difficult due to multiple diverticula in the                         colon. The patient tolerated the procedure well. The                         quality of the bowel preparation was  good. Findings:      Multiple large-mouthed diverticula were found in the recto-sigmoid       colon, sigmoid colon and descending colon.      A 10 mm polyp was found in the mid descending colon. The polyp was       sessile. The polyp was removed with a hot snare. Resection and retrieval       were complete. To prevent bleeding post-intervention, one hemostatic       clip was successfully placed (MR conditional). There was no bleeding at       the end of the procedure.      The retroflexed view of the distal rectum and anal verge was normal and       showed no anal or rectal abnormalities. Impression:            - Diverticulosis in the recto-sigmoid colon, in the                         sigmoid colon and in the descending colon.                        - One 10 mm polyp in the mid descending colon, removed                         with a hot snare. Resected and retrieved. Clip (MR                         conditional) was placed.                        - The distal rectum and anal verge are normal on                         retroflexion view. Recommendation:        - Telephone endoscopist for pathology results in 1                         week. Procedure Code(s):     --- Professional ---                        [phone], Colonoscopy, flexible; with removal of                         tumor(s), polyp(s), or other lesion(s) by snare                         technique Diagnosis Code(s):     --- Professional ---  Z85.038, Personal history of other malignant neoplasm                         of large intestine                        K63.5, Polyp of colon                        K57.30, Diverticulosis of large intestine without                         perforation or abscess without bleeding CPT copyright 2019 American [employer] Association. All rights reserved. The codes documented in this report are preliminary and upon coder review may  be revised to meet current compliance  requirements. Robert Bellow, MD 05/27/2021 10:14:43 AM This report has been signed electronically. Number of Addenda: 0 Note Initiated On: 05/27/2021 9:17 AM Scope Withdrawal Time: 0 hours 18 minutes 35 seconds  Total Procedure Duration: 0 hours 29 minutes 24 seconds  Estimated Blood Loss:  Estimated blood loss: none.      [facility]

## 2021-05-27 NOTE — H&P (Signed)
Annette Mccullough [id number] Nov 20, 1945     HPI:  1 year s/p right hemi-colectomy for cancer.  Follow up endoscopy.   Medications Prior to Admission  Medication Sig Dispense Refill Last Dose   aspirin EC 81 MG tablet Take 81 mg by mouth daily. Swallow whole.   05/26/2021   DULoxetine (CYMBALTA) 60 MG capsule Take 60 mg by mouth daily.   05/27/2021 at 0600   lisinopril (ZESTRIL) 20 MG tablet Take 1 tablet (20 mg total) by mouth daily. 1 tablet 1 05/27/2021 at 0600   metoprolol tartrate (LOPRESSOR) 25 MG tablet Take 25 mg by mouth daily.   05/27/2021 at 0600   Multiple Vitamin (MULTIVITAMIN) tablet Take 1 tablet by mouth daily.      cyclobenzaprine (FLEXERIL) 10 MG tablet Take 10 mg by mouth 3 (three) times daily as needed for muscle spasms.      diphenhydrAMINE (BENADRYL) 25 MG tablet Take 25 mg by mouth daily as needed for allergies. (Patient not taking: Reported on 03/10/2021)      docusate sodium (COLACE) 100 MG capsule Take 100 mg by mouth 2 (two) times daily.       ferrous sulfate 325 (65 FE) MG tablet Take 325 mg by mouth daily. Take with 4 oz of orange juice      hydrochlorothiazide (HYDRODIURIL) 25 MG tablet Take 1 tablet (25 mg total) by mouth every morning. 1 tablet 1    hydroxypropyl methylcellulose (ISOPTO TEARS) 2.5 % ophthalmic solution Place 2 drops into both eyes 4 (four) times daily as needed (for dry eyes).       oxyCODONE-acetaminophen (PERCOCET) 10-325 MG tablet Take 1 tablet by mouth 4 (four) times daily.      pantoprazole (PROTONIX) 40 MG tablet Take 40 mg by mouth daily before breakfast.      potassium chloride SA (KLOR-CON) 20 MEQ tablet Take 20 mEq by mouth daily.      Allergies  Allergen Reactions   Eggs Or Egg-Derived Products Other (See Comments)    Pt. States egg yoke causes abdominal discomfort if she eats fresh eggs   Tape Swelling    Blisters, paper tape is ok   Past Medical History:  Diagnosis Date   Arthritis    multiple areas   Blood transfusion    74-  post childbirth & /w joint replacement    Cancer ([place])    Chronic kidney disease    renal calculi /w pregnancy   Complication of anesthesia    post anesthesia - swelling of lip- relative to tape   Family history of adverse reaction to anesthesia    sister- slow to wake up and " has died once "    Fibromyalgia    GERD (gastroesophageal reflux disease)    Heart murmur    rec's antibiotic prior to dental work, never had any cardiac testing done   History of kidney stones    Hypertension    Neuromuscular disorder ([place])    DDD- lumbar & cervical    Psoriasis    Seasonal allergies    Stroke [facility])    1972 & 2005, 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- some weakness remains    Past Surgical History:  Procedure Laterality Date   ABDOMINAL HYSTERECTOMY     APPENDECTOMY     CERVICAL FUSION     2011   CESAREAN SECTION     2 times    COLONOSCOPY WITH PROPOFOL N/A 05/07/2020   Procedure: COLONOSCOPY WITH PROPOFOL;  Surgeon: Robert Bellow, MD;  Location: [facility] ENDOSCOPY;  Service: Endoscopy;  Laterality: N/A;   CYST EXCISION Right    hand, and both ovaries   ESOPHAGOGASTRODUODENOSCOPY N/A 05/07/2020   Procedure: ESOPHAGOGASTRODUODENOSCOPY (EGD);  Surgeon: Robert Bellow, MD;  Location: [facility] ENDOSCOPY;  Service: Endoscopy;  Laterality: N/A;   EYE SURGERY     bilateral cataract surgery    JOINT REPLACEMENT     1999- R knee   LAPAROSCOPIC RIGHT COLECTOMY Right 05/26/2020   Procedure: LAPAROSCOPIC RIGHT HEMI-COLECTOMY;  Surgeon: Robert Bellow, MD;  Location: [place] [facility];  Service: General;  Laterality: Right;   LUMBAR WOUND DEBRIDEMENT  09/15/2011   Procedure: LUMBAR WOUND DEBRIDEMENT;  Surgeon: Dahlia Bailiff;  Location: [place];  Service: Orthopedics;  Laterality: N/A;  REPEAT I&D AND WOUND CLOSURE OF SPINAL WOUND   OVARIAN CYST REMOVAL     SPINAL CORD STIMULATOR BATTERY EXCHANGE Left 02/07/2013   Procedure: REMOVAL OF HARDWARE (BATTERY REMOVAL);  Surgeon: Melina Schools, MD;  Location: [place];  Service:  Orthopedics;  Laterality: Left;  removal of spinal cord stimulator battery   SPINAL CORD STIMULATOR INSERTION  08/25/2011   Procedure: LUMBAR SPINAL CORD STIMULATOR INSERTION;  Surgeon: Dahlia Bailiff;  Location: [place];  Service: Orthopedics;  Laterality: N/A;   TONSILLECTOMY     as a child   TOTAL KNEE REVISION Right 01/21/2016   Procedure: RIGHT TOTAL KNEE REVISION;  Surgeon: Gaynelle Arabian, MD;  Location: [facility];  Service: Orthopedics;  Laterality: Right;   TRIGGER FINGER RELEASE Right    thumb   Social History   Socioeconomic History   Marital status: Widowed    Spouse name: Not on file   Number of children: Not on file   Years of education: Not on file   Highest education level: Not on file  Occupational History   Not on file  Tobacco Use   Smoking status: Former    Types: Cigarettes    Quit date: 08/17/2004    Years since quitting: 16.7   Smokeless tobacco: Never  Vaping Use   Vaping Use: Never used  Substance and Sexual Activity   Alcohol use: Yes    Comment: rare   Drug use: No   Sexual activity: Not on file  Other Topics Concern   Not on file  Social History Narrative   Lives in [place]; self; 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      ROS: Negative.     PE: HEENT: Negative. Lungs: Clear. Cardio: RR.   Assessment/Plan:  Proceed with planned endoscopy.   Forest Gleason [facility] 05/27/2021

## 2021-05-27 NOTE — Anesthesia Preprocedure Evaluation (Addendum)
Anesthesia Evaluation  Patient identified by MRN, date of birth, ID band Patient awake    Reviewed: Allergy & Precautions, NPO status , Patient's Chart, lab work & pertinent test results  History of Anesthesia Complications (+) Family history of anesthesia reaction and history of anesthetic complications (post anesthesia - swelling of lip- relative to tape)  Airway Mallampati: III  TM Distance: >3 FB Neck ROM: Full    Dental  (+) Upper Dentures, Missing, Dental Advidsory Given   Pulmonary neg shortness of breath, neg sleep apnea, neg COPD, neg recent URI, former smoker,    breath sounds clear to auscultation- rhonchi (-) wheezing      Cardiovascular Exercise Tolerance: Poor hypertension, Pt. on medications (-) angina(-) CAD, (-) Past MI, (-) Cardiac Stents and (-) CABG (-) dysrhythmias + Valvular Problems/Murmurs  Rhythm:Regular Rate:Normal - Systolic murmurs and - Diastolic murmurs    Neuro/Psych  Neuromuscular disease CVA (L sided weakness), Residual Symptoms negative psych ROS   GI/Hepatic Neg liver ROS, GERD  Controlled and Medicated,  Endo/Other  neg diabetesMorbid obesity  Renal/GU Renal disease (hx of nephrolithiasis)     Musculoskeletal  (+) Arthritis , Fibromyalgia -  Abdominal (+) + obese,   Peds  Hematology negative hematology ROS (+)   Anesthesia Other Findings Past Medical History: No date: Arthritis     Comment:  multiple areas No date: Blood transfusion     Comment:  2- post childbirth & /w joint replacement  No date: Chronic kidney disease     Comment:  renal calculi /w pregnancy No date: Complication of anesthesia     Comment:  post anesthesia - swelling of lip- relative to tape No date: Family history of adverse reaction to anesthesia     Comment:  sister- slow to wake up and " has died once "  No date: Fibromyalgia No date: Heart murmur     Comment:  rec's antibiotic prior to dental work,  never had any               cardiac testing done No date: Hypertension No date: Neuromuscular disorder ([place])     Comment:  DDD- lumbar & cervical  No date: Psoriasis No date: Seasonal allergies No date: Stroke [facility])     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, 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- some weakness remains    Reproductive/Obstetrics                            Anesthesia Physical  Anesthesia Plan  ASA: III  Anesthesia Plan: General   Post-op Pain Management:    Induction: Intravenous  PONV Risk Score and Plan: 2 and TIVA and Treatment may vary due to age or medical condition  Airway Management Planned: Nasal Cannula  Additional Equipment:   Intra-op Plan:   Post-operative Plan: Extubation in OR  Informed Consent: I have reviewed the patients History and Physical, chart, labs and discussed the procedure including the risks, benefits and alternatives for the proposed anesthesia with the patient or authorized representative who has indicated his/her understanding and acceptance.     Dental advisory given  Plan Discussed with: CRNA and Anesthesiologist  Anesthesia Plan Comments:        Anesthesia Quick Evaluation

## 2021-05-28 ENCOUNTER — Encounter: Payer: Self-pay | Admitting: General Surgery

## 2021-05-28 LAB — SURGICAL PATHOLOGY

## 2021-05-28 NOTE — Anesthesia Postprocedure Evaluation (Signed)
Anesthesia Post Note  Patient: Annette Mccullough  Procedure(s) Performed: COLONOSCOPY WITH PROPOFOL  Patient location during evaluation: Endoscopy Anesthesia Type: General Level of consciousness: awake and alert Pain management: pain level controlled Vital Signs Assessment: post-procedure vital signs reviewed and stable Respiratory status: spontaneous breathing, nonlabored ventilation and respiratory function stable Cardiovascular status: blood pressure returned to baseline and stable Postop Assessment: no apparent nausea or vomiting Anesthetic complications: no   No notable events documented.   Last Vitals:  Vitals:   05/27/21 1025 05/27/21 1035  BP: 133/63 (!) 127/59  Pulse: 63 62  Resp: 19 (!) 26  Temp:    SpO2: 100% 99%    Last Pain:  Vitals:   05/27/21 1035  TempSrc:   PainSc: 0-No pain                 [place] Ouch

## 2021-06-10 ENCOUNTER — Inpatient Hospital Stay: Payer: Medicare Other | Attending: Internal Medicine

## 2021-06-22 ENCOUNTER — Encounter: Payer: Self-pay | Admitting: Internal Medicine

## 2021-06-25 ENCOUNTER — Other Ambulatory Visit: Payer: Self-pay | Admitting: Internal Medicine

## 2021-06-25 DIAGNOSIS — Z1231 Encounter for screening mammogram for malignant neoplasm of breast: Secondary | ICD-10-CM

## 2021-07-16 ENCOUNTER — Other Ambulatory Visit: Payer: Self-pay

## 2021-07-16 ENCOUNTER — Ambulatory Visit
Admission: RE | Admit: 2021-07-16 | Discharge: 2021-07-16 | Disposition: A | Discharge status: Home / Self Care | Payer: Medicare Other | Source: Ambulatory Visit | Attending: Internal Medicine | Admitting: Internal Medicine

## 2021-07-16 DIAGNOSIS — Z1231 Encounter for screening mammogram for malignant neoplasm of breast: Secondary | ICD-10-CM | POA: Diagnosis present

## 2021-09-09 ENCOUNTER — Inpatient Hospital Stay: Payer: Medicare Other | Admitting: Internal Medicine

## 2021-09-09 ENCOUNTER — Inpatient Hospital Stay: Payer: Medicare Other | Attending: Internal Medicine

## 2021-10-06 ENCOUNTER — Telehealth: Payer: Self-pay | Admitting: Internal Medicine

## 2021-10-06 DIAGNOSIS — U071 COVID-19: Secondary | ICD-10-CM

## 2021-10-06 HISTORY — DX: COVID-19: U07.1

## 2021-10-06 NOTE — Telephone Encounter (Signed)
FYI we need to resch pt Covid + wanted you to know since she suppose to have a specialty lab

## 2021-10-06 NOTE — Telephone Encounter (Signed)
Pt called to cancel her appt for 1-4. Pt has Covid. Call back at [phone]

## 2021-10-07 ENCOUNTER — Inpatient Hospital Stay: Payer: Medicare Other

## 2021-10-07 ENCOUNTER — Inpatient Hospital Stay: Payer: Medicare Other | Admitting: Internal Medicine

## 2021-10-15 ENCOUNTER — Encounter: Payer: Self-pay | Admitting: *Deleted

## 2021-10-21 ENCOUNTER — Inpatient Hospital Stay (HOSPITAL_BASED_OUTPATIENT_CLINIC_OR_DEPARTMENT_OTHER): Payer: Medicare Other | Admitting: Internal Medicine

## 2021-10-21 ENCOUNTER — Encounter: Payer: Self-pay | Admitting: Internal Medicine

## 2021-10-21 ENCOUNTER — Inpatient Hospital Stay: Payer: Medicare Other | Attending: Internal Medicine

## 2021-10-21 ENCOUNTER — Other Ambulatory Visit: Payer: Self-pay

## 2021-10-21 VITALS — BP 128/84 | HR 77 | Temp 98.5°F | Ht 67.0 in | Wt 259.8 lb

## 2021-10-21 DIAGNOSIS — C182 Malignant neoplasm of ascending colon: Secondary | ICD-10-CM | POA: Diagnosis present

## 2021-10-21 DIAGNOSIS — D509 Iron deficiency anemia, unspecified: Secondary | ICD-10-CM | POA: Insufficient documentation

## 2021-10-21 DIAGNOSIS — Z87891 Personal history of nicotine dependence: Secondary | ICD-10-CM | POA: Diagnosis not present

## 2021-10-21 LAB — CBC WITH DIFFERENTIAL/PLATELET
Abs Immature Granulocytes: 0.02 10*3/uL (ref 0.00–0.07)
Basophils Absolute: 0.1 10*3/uL (ref 0.0–0.1)
Basophils Relative: 1 %
Eosinophils Absolute: 0.2 10*3/uL (ref 0.0–0.5)
Eosinophils Relative: 2 %
HCT: 39.6 % (ref 36.0–46.0)
Hemoglobin: 13.2 g/dL (ref 12.0–15.0)
Immature Granulocytes: 0 %
Lymphocytes Relative: 16 %
Lymphs Abs: 1.4 10*3/uL (ref 0.7–4.0)
MCH: 31.7 pg (ref 26.0–34.0)
MCHC: 33.3 g/dL (ref 30.0–36.0)
MCV: 95 fL (ref 80.0–100.0)
Monocytes Absolute: 0.5 10*3/uL (ref 0.1–1.0)
Monocytes Relative: 6 %
Neutro Abs: 6.6 10*3/uL (ref 1.7–7.7)
Neutrophils Relative %: 75 %
Platelets: 269 10*3/uL (ref 150–400)
RBC: 4.17 MIL/uL (ref 3.87–5.11)
RDW: 13.6 % (ref 11.5–15.5)
WBC: 8.7 10*3/uL (ref 4.0–10.5)
nRBC: 0 % (ref 0.0–0.2)

## 2021-10-21 LAB — COMPREHENSIVE METABOLIC PANEL
ALT: 11 U/L (ref 0–44)
AST: 16 U/L (ref 15–41)
Albumin: 3.5 g/dL (ref 3.5–5.0)
Alkaline Phosphatase: 83 U/L (ref 38–126)
Anion gap: 9 (ref 5–15)
BUN: 21 mg/dL (ref 8–23)
CO2: 27 mmol/L (ref 22–32)
Calcium: 9.5 mg/dL (ref 8.9–10.3)
Chloride: 103 mmol/L (ref 98–111)
Creatinine, Ser: 0.78 mg/dL (ref 0.44–1.00)
GFR, Estimated: >60 mL/min (ref >60)
Glucose, Bld: 117 mg/dL — ABNORMAL HIGH (ref 70–99)
Potassium: 3.9 mmol/L (ref 3.5–5.1)
Sodium: 139 mmol/L (ref 135–145)
Total Bilirubin: 0.4 mg/dL (ref 0.3–1.2)
Total Protein: 7.3 g/dL (ref 6.5–8.1)

## 2021-10-21 NOTE — Assessment & Plan Note (Addendum)
#  Right-sided colon cancer-stage II; positive for LVI; s MSI-STABLE.  Baseline signetera test- NEGATIVE for circulating tumor DNA.  No adjuvant chemotherapy  #Signatera testing negative in the interim.  S/p testing this morning pending.  S/p colonoscopy in August 2022- serrated dysplastic polyp; repeat colo-3 years [aug 2025].    #Iron deficient anemia-hemoglobin 9-10; secondary Hx of colon ca; today Hb 13.4; iron pills TIW;   # DISPOSITION: # in 12 months- MD; labs- cbc/cmp;[facility] -Dr.B  Cc; Dr.Johnston/[place].

## 2021-10-21 NOTE — Progress Notes (Signed)
[place] NOTE  Patient Care Team: Annette Hire, MD as PCP - General (Internal Medicine) Annette Sickle, MD as Consulting Physician (Internal Medicine) Annette Mccullough, Annette Gleason, MD as Consulting Physician (General Surgery) Annette Jacks, RN as Oncology Nurse Navigator  CHIEF COMPLAINTS/PURPOSE OF CONSULTATION: COLON CANCER  #  Oncology History Overview Note  # AUG 2021-Colon cancer -s/p right hemicolectomy; Dr. Tollie Pizza; moderately differentiated; pT3;pN0-0/31 LN; STage II [no tumor perforation; negative margins; moderate differentiated; positive for LVI; MSI stable]; [facility]- SEP 2021- NEGATIVE FOR CT-DNA  #No adjuvant therapy   # Chronic back pain [pain clinic- on oxycodone]; PN- ? Chronic back pain.   # SURVIVORSHIP:   # GENETICS: NONE  DIAGNOSIS: colon cancer- right  STAGE:   II      ;  GOALS: cure  CURRENT/MOST RECENT THERAPY : Surveillance   Cancer of right colon ([place])  05/26/2020 Initial Diagnosis   Cancer of right colon ([place])      HISTORY OF PRESENTING ILLNESS: alone; walks with a cane.  Annette Mccullough 76 y.o.  female patient with above diagnosis of stage II right-sided colon cancer is here for follow-up/review the results.  In the interim patient had a colonoscopy-serrated polyp. Patient has had [facility] testing in the interim-negative.  Patient denies any blood in stools or black-colored stools.  No abdominal pain no nausea no vomiting.    Review of Systems  Constitutional:  Negative for chills, diaphoresis, fever and weight loss.  HENT:  Negative for nosebleeds and sore throat.   Eyes:  Negative for double vision.  Respiratory:  Negative for cough, hemoptysis, sputum production, shortness of breath and wheezing.   Cardiovascular:  Negative for chest pain, palpitations, orthopnea and leg swelling.  Gastrointestinal:  Negative for abdominal pain, blood in stool, constipation, diarrhea, heartburn, melena, nausea and  vomiting.  Genitourinary:  Negative for dysuria, frequency and urgency.  Musculoskeletal:  Positive for back pain and joint pain.  Skin: Negative.  Negative for itching and rash.  Neurological:  Positive for tingling. Negative for dizziness, focal weakness, weakness and headaches.  Endo/Heme/Allergies:  Does not bruise/bleed easily.  Psychiatric/Behavioral:  Negative for depression. The patient is not nervous/anxious and does not have insomnia.     MEDICAL HISTORY:  Past Medical History:  Diagnosis Date   Arthritis    multiple areas   Blood transfusion    86- post childbirth & /w joint replacement    Chronic kidney disease    renal calculi /w pregnancy   Colon cancer, ascending (Annette Mccullough) 06/01/2020   6 cm, T3,NO, low risk MSI.   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- some weakness remains     SURGICAL HISTORY: Past Surgical History:  Procedure Laterality Date   ABDOMINAL HYSTERECTOMY     APPENDECTOMY     CERVICAL FUSION  2011   CESAREAN SECTION     2 times    COLONOSCOPY WITH PROPOFOL N/A 05/07/2020   Procedure: COLONOSCOPY WITH PROPOFOL;  Surgeon: Robert Bellow, MD;  Location: [facility] ENDOSCOPY;  Service: Endoscopy;  Laterality: N/A;   COLONOSCOPY WITH PROPOFOL N/A 05/27/2021   Procedure: COLONOSCOPY WITH PROPOFOL;  Surgeon: Robert Bellow, MD;  Location: [facility] ENDOSCOPY;  Service: Endoscopy;  Laterality: N/A;   CYST EXCISION Right    hand, and both ovaries    ESOPHAGOGASTRODUODENOSCOPY N/A 05/07/2020   Procedure: ESOPHAGOGASTRODUODENOSCOPY (EGD);  Surgeon: Robert Bellow, MD;  Location[facility] END[facility]COPY;  Service: Endoscopy;  Laterality: N/A;   EYE SURGERY     bilateral cataract surgery    JOINT REPLACEMENT     1999- R knee   LAPAROSCOPIC RIGHT COLECTOMY Right 05/26/2020   Procedure: LAPAROSCOPIC RIGHT HEMI-COLECTOMY;  Surgeon: Robert Bellow, MD;  Location: [facility];  Service: General;  Laterality: Right;   LUMBAR WOUND DEBRIDEMENT  09/15/2011   Procedure: LUMBAR WOUND DEBRIDEMENT;  Surgeon: Dahlia Bailiff;  Location: [place] [facility];  Service: Orthopedics;  Laterality: N/A;  REPEAT I&D AND WOUND CLOSURE OF SPINAL WOUND   OVARIAN CYST REMOVAL     SPINAL CORD STIMULATOR BATTERY EXCHANGE Left 02/07/2013   Procedure: REMOVAL OF HARDWARE (BATTERY REMOVAL);  Surgeon: Melina Schools, MD;  Location: [facility];  Service: Orthopedics;  Laterality: Left;  removal of spinal cord stimulator battery   SPINAL CORD STIMULATOR INSERTION  08/25/2011   Procedure: LUMBAR SPINAL CORD STIMULATOR INSERTION;  Surgeon: Dahlia Bailiff;  Location: [facility];  Service: Orthopedics;  Laterality: N/A;   TONSILLECTOMY     as a child   TOTAL KNEE REVISION Right 01/21/2016   Procedure: RIGHT TOTAL KNEE REVISION;  Surgeon: Gaynelle Arabian, MD;  Location: [facility];  Service: Orthopedics;  Laterality: Right;   TRIGGER FINGER RELEASE Right    thumb    SOCIAL HISTORY: Social History   Socioeconomic History   Marital status: Widowed    Spouse name: Not on file   Number of children: Not on file   Years of education: Not on file   Highest education level: Not on file  Occupational History   Not on file  Tobacco Use   Smoking status: Former    Types: Cigarettes    Quit date: 08/17/2004    Years since quitting: 17.1   Smokeless tobacco: Never  Vaping Use   Vaping Use: Never used  Substance and Sexual Activity   Alcohol use: Yes    Comment: rare   Drug use: No   Sexual activity: Not on  file  Other Topics Concern   Not on file  Social History Narrative   Lives in [place]; self; 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    Social Determinants of Health   Financial Resource Strain: Not on file  Food Insecurity: Not on file  Transportation Needs: Not on file  Physical Activity: Not on file  Stress: Not on file  Social Connections: Not on file  Intimate Partner Violence: Not on file    FAMILY HISTORY: Family History  Problem Relation Age of Onset   Anesthesia problems Sister    Breast cancer Sister 18   Breast cancer Mother 64   Breast cancer Maternal Grandmother 31    ALLERGIES:  is allergic to eggs or egg-derived products and tape.  MEDICATIONS:  Current Outpatient Medications  Medication Sig Dispense Refill   aspirin EC 81 MG tablet  Take 81 mg by mouth daily. Swallow whole.     cyclobenzaprine (FLEXERIL) 10 MG tablet Take 10 mg by mouth 3 (three) times daily as needed for muscle spasms.     diphenhydrAMINE (BENADRYL) 25 MG tablet Take 25 mg by mouth daily as needed for allergies.     docusate sodium (COLACE) 100 MG capsule Take 100 mg by mouth 2 (two) times daily.      DULoxetine (CYMBALTA) 60 MG capsule Take 60 mg by mouth daily.     ferrous sulfate 325 (65 FE) MG tablet Take 325 mg by mouth. Take with 4 oz of orange juice -takes once q3days     gabapentin (NEURONTIN) 100 MG capsule Take 100 mg by mouth 3 (three) times daily.     hydrochlorothiazide (HYDRODIURIL) 25 MG tablet Take 1 tablet (25 mg total) by mouth every morning. 1 tablet 1   hydroxypropyl methylcellulose (ISOPTO TEARS) 2.5 % ophthalmic solution Place 2 drops into both eyes 4 (four) times daily as needed (for dry eyes).      lisinopril (ZESTRIL) 20 MG tablet Take 1 tablet (20 mg total) by mouth daily. 1 tablet 1   Multiple Vitamin (MULTIVITAMIN) tablet Take 1 tablet by mouth daily.     oxyCODONE-acetaminophen (PERCOCET) 10-325 MG tablet Take 1 tablet by  mouth 4 (four) times daily.     pantoprazole (PROTONIX) 40 MG tablet Take 40 mg by mouth daily before breakfast.     No current facility-administered medications for this visit.      Marland Kitchen  PHYSICAL EXAMINATION: ECOG PERFORMANCE STATUS: 0 - Asymptomatic  Vitals:   10/21/21 1446  BP: 128/84  Pulse: 77  Temp: 98.5 F (36.9 C)  SpO2: 99%   Filed Weights   10/21/21 1446  Weight: 259 lb 12.8 oz (117.8 kg)    Physical Exam HENT:     Head: Normocephalic and atraumatic.     Mouth/Throat:     Pharynx: No oropharyngeal exudate.  Eyes:     Pupils: Pupils are equal, round, and reactive to light.  Cardiovascular:     Rate and Rhythm: Normal rate and regular rhythm.  Pulmonary:     Effort: Pulmonary effort is normal. No respiratory distress.     Breath sounds: Normal breath sounds. No wheezing.  Abdominal:     General: Bowel sounds are normal. There is no distension.     Palpations: Abdomen is soft. There is no mass.     Tenderness: There is no abdominal tenderness. There is no guarding or rebound.  Musculoskeletal:        General: No tenderness. Normal range of motion.     Cervical back: Normal range of motion and neck supple.  Skin:    General: Skin is warm.  Neurological:     Mental Status: She is alert and oriented to person, place, and time.  Psychiatric:        Mood and Affect: Affect normal.     LABORATORY DATA:  I have reviewed the data as listed Lab Results  Component Value Date   WBC 8.7 10/21/2021   HGB 13.2 10/21/2021   HCT 39.6 10/21/2021   MCV 95.0 10/21/2021   PLT 269 10/21/2021   Recent Labs    03/10/21 0945 10/21/21 1426  NA 137 139  K 4.0 3.9  CL 102 103  CO2 24 27  GLUCOSE 120* 117*  BUN 23 21  CREATININE 0.84 0.78  CALCIUM 9.4 9.5  GFRNONAA >60 >60  PROT 7.1 7.3  ALBUMIN 3.6 3.5  AST 18 16  ALT 11 11  ALKPHOS 68 83  BILITOT 0.4 0.4    RADIOGRAPHIC STUDIES: I have personally reviewed the radiological images as listed and agreed  with the findings in the report. No results found.  ASSESSMENT & PLAN:   Cancer of right colon ([place]) # Right-sided colon cancer-stage II; positive for LVI; s MSI-STABLE.  Baseline sign[facility]a test- NEGATIVE for circulating tumor DNA.  No adjuvant chemotherapy  #[facility] testing negative in the interim.  S/p testing this morning pending.  S/p colonoscopy in August 2022- serrated dysplastic polyp; repeat colo-3 years [aug 2025].    #Iron deficient anemia-hemoglobin 9-10; secondary Hx of colon ca; today Hb 13.4; iron pills TIW;   # DISPOSITION: # in 12 months- MD; labs- cbc/cmp;CEA -Dr.B  Cc; Dr.Johnston/Byrnett.   All questions were answered. The patient knows to call the clinic with any problems, questions or concerns.    Annette Sickle, MD 10/22/2021 2:12 PM

## 2021-10-22 LAB — CEA: CEA: 3.1 ng/mL (ref 0.0–4.7)

## 2021-11-02 ENCOUNTER — Encounter: Payer: Self-pay | Admitting: Internal Medicine

## 2021-12-29 ENCOUNTER — Other Ambulatory Visit: Payer: Self-pay | Admitting: Anesthesiology

## 2021-12-29 ENCOUNTER — Ambulatory Visit
Admission: RE | Admit: 2021-12-29 | Discharge: 2021-12-29 | Disposition: A | Discharge status: Home / Self Care | Payer: Medicare Other | Source: Ambulatory Visit | Attending: Anesthesiology | Admitting: Anesthesiology

## 2021-12-29 DIAGNOSIS — S335XXA Sprain of ligaments of lumbar spine, initial encounter: Secondary | ICD-10-CM

## 2022-02-01 ENCOUNTER — Telehealth: Payer: Self-pay

## 2022-02-01 NOTE — Telephone Encounter (Signed)
Attempted to call patient to inform her that she is due for [facility] lab draw but no answer and no voicemail available.  Will attempt again and get her scheduled for [place] lab only appt. ?

## 2022-02-01 NOTE — Telephone Encounter (Addendum)
Please contact patient to schedule lab only appt with "[facility] lab" in schedule note.  Thanks 

## 2022-02-02 NOTE — Telephone Encounter (Signed)
Sign[facility]a test kit taken to the lab. ?

## 2022-02-03 ENCOUNTER — Inpatient Hospital Stay: Payer: Medicare Other | Attending: Internal Medicine

## 2022-02-03 DIAGNOSIS — C182 Malignant neoplasm of ascending colon: Secondary | ICD-10-CM

## 2022-02-11 ENCOUNTER — Encounter: Payer: Self-pay | Admitting: Internal Medicine

## 2022-05-05 ENCOUNTER — Telehealth: Payer: Self-pay

## 2022-05-05 NOTE — Telephone Encounter (Signed)
Please contact patient to schedule lab only appt with "[place] lab" in schedule note.

## 2022-05-07 NOTE — Telephone Encounter (Signed)
[facility] test kit taken down to the lab.

## 2022-05-12 ENCOUNTER — Inpatient Hospital Stay: Payer: Medicare Other | Attending: Internal Medicine

## 2022-05-19 ENCOUNTER — Encounter: Payer: Self-pay | Admitting: Internal Medicine

## 2022-06-29 ENCOUNTER — Other Ambulatory Visit: Payer: Self-pay | Admitting: Internal Medicine

## 2022-06-29 DIAGNOSIS — Z1231 Encounter for screening mammogram for malignant neoplasm of breast: Secondary | ICD-10-CM

## 2022-07-03 IMAGING — CT CT ABD-PELV W/ CM
2 of 5 series · 15 of 46 positions shown, 17 images · IV contrast (omnipaque)
Comparison: 02/25/2012

CLINICAL DATA: Colorectal cancer, staging examination

EXAM:
CT ABDOMEN AND PELVIS WITH CONTRAST
TECHNIQUE: Multidetector CT imaging of the abdomen and pelvis was performed
using the standard protocol following bolus administration of
intravenous contrast.
CONTRAST:  100mL OMNIPAQUE IOHEXOL 300 MG/ML  SOLN

[Series 2: abd pelvis 5.00 · axial · 0.77mm/px · z∈[-1496,-1086]mm · 12 of 94 slices shown, 14 images]
[im 6/94  soft-tissue]
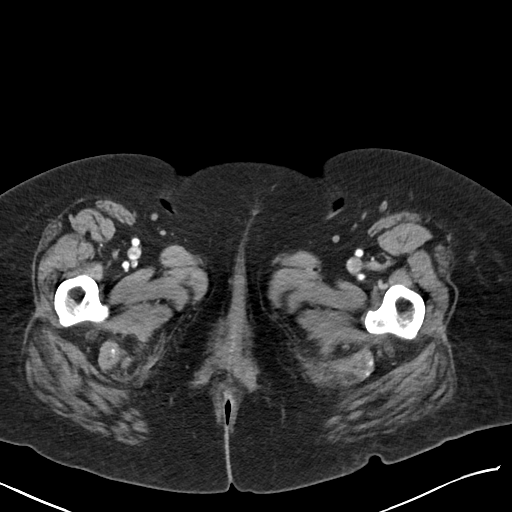
[im 6/94  bone]
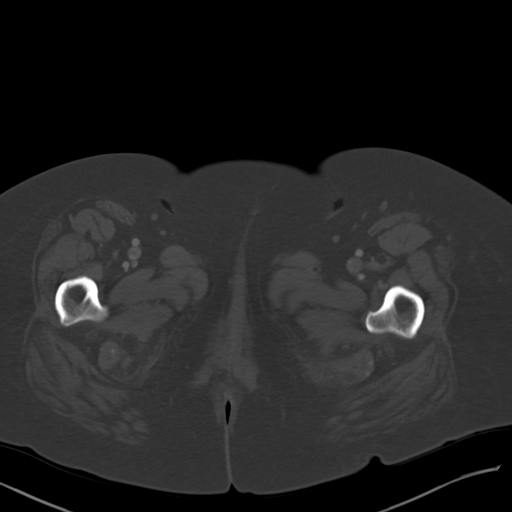
[im 16/94  soft-tissue]
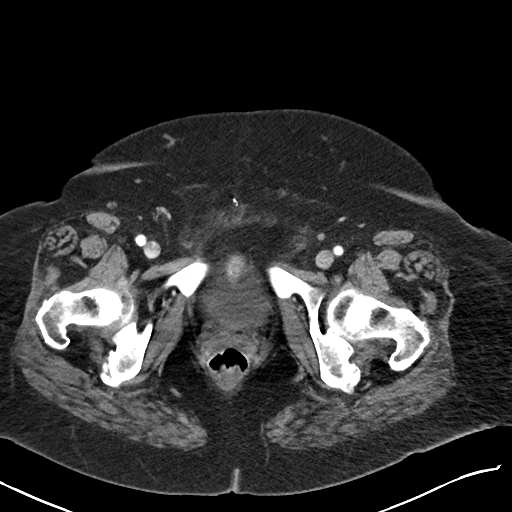
[im 21/94  soft-tissue]
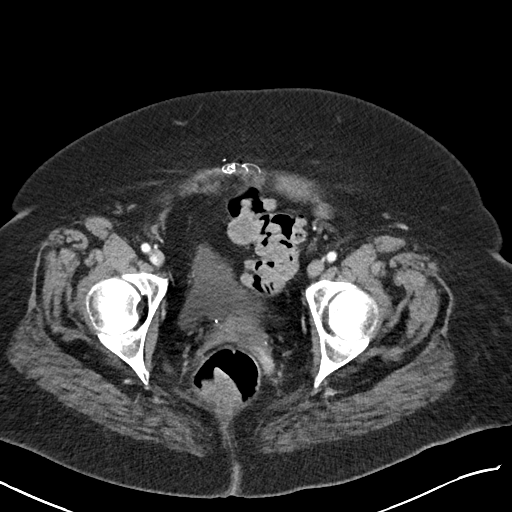
[im 26/94  soft-tissue]
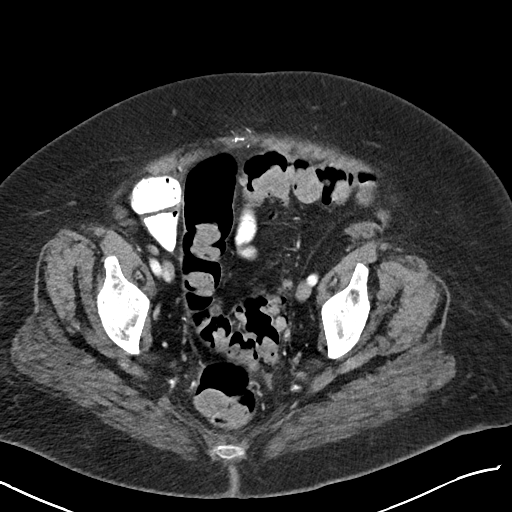
[im 37/94  soft-tissue]
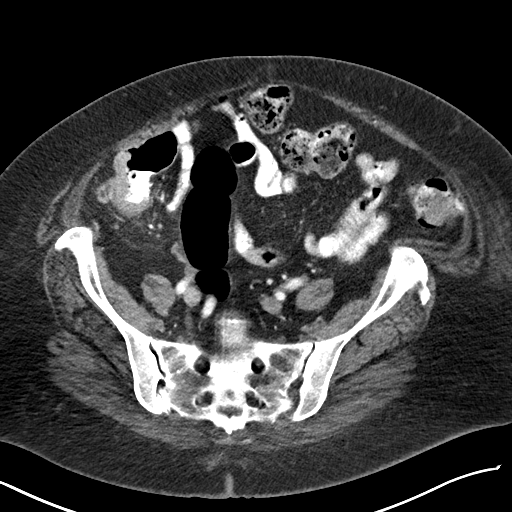
[im 42/94  soft-tissue]
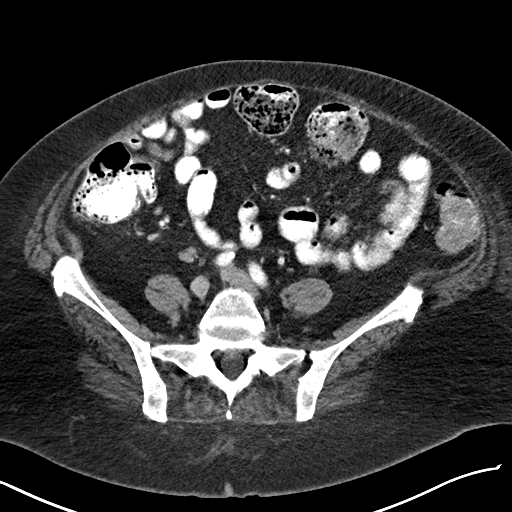
[im 52/94  soft-tissue]
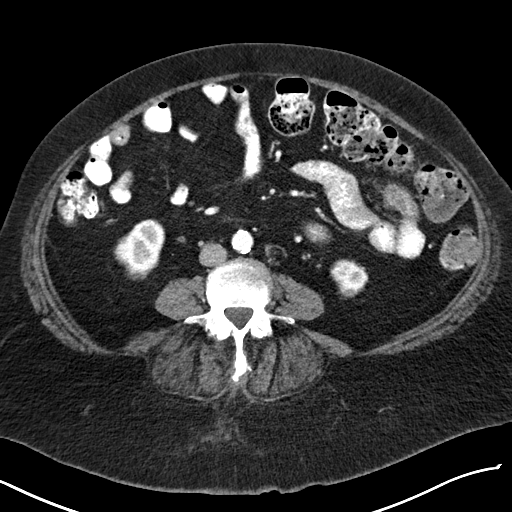
[im 57/94  soft-tissue]
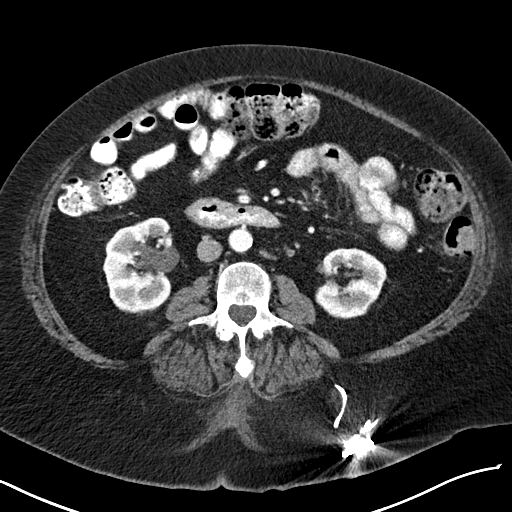
[im 68/94  soft-tissue]
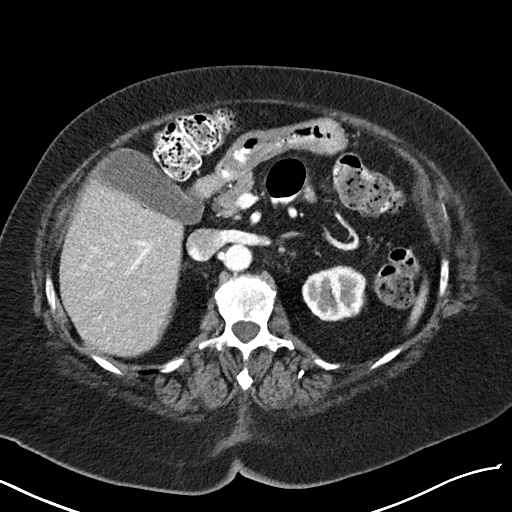
[im 68/94  bone]
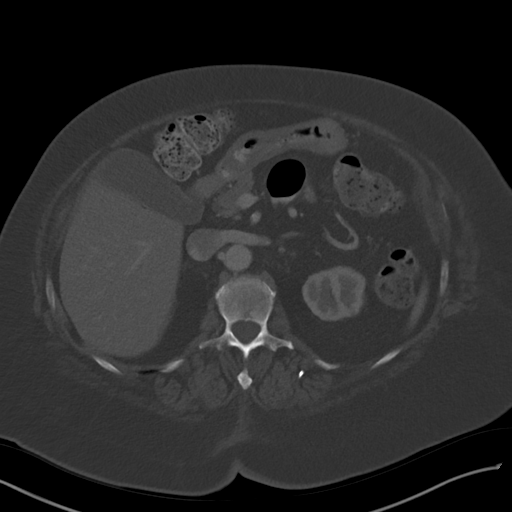
[im 73/94  soft-tissue]
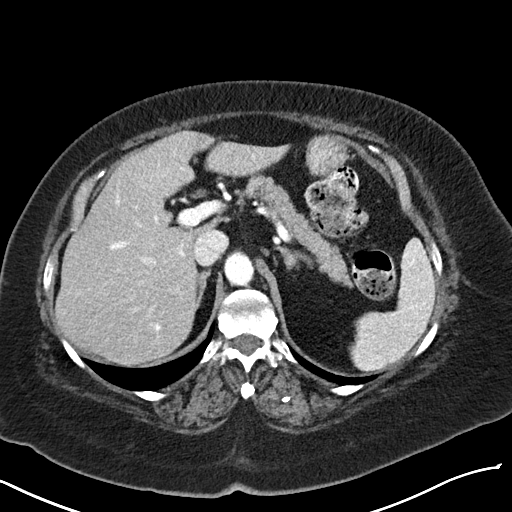
[im 78/94  soft-tissue]
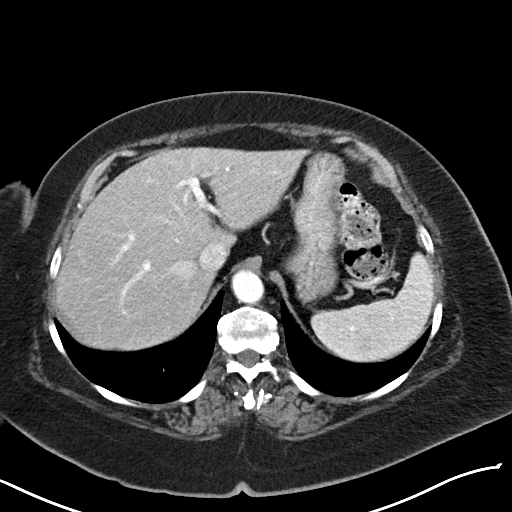
[im 88/94  soft-tissue]
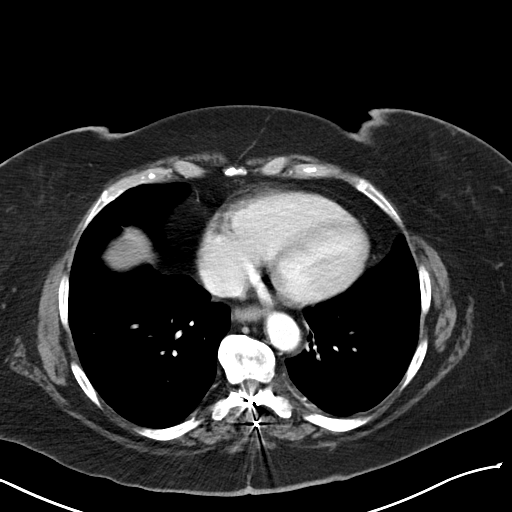

[Series 4: coronals abd pelvis 2.00 cor · coronal · 0.77mm/px · 3 of 174 slices shown]
[im 58/174  soft-tissue]
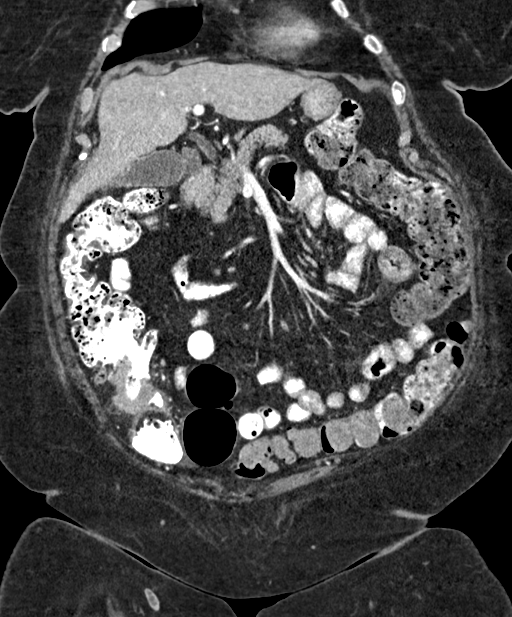
[im 77/174  soft-tissue]
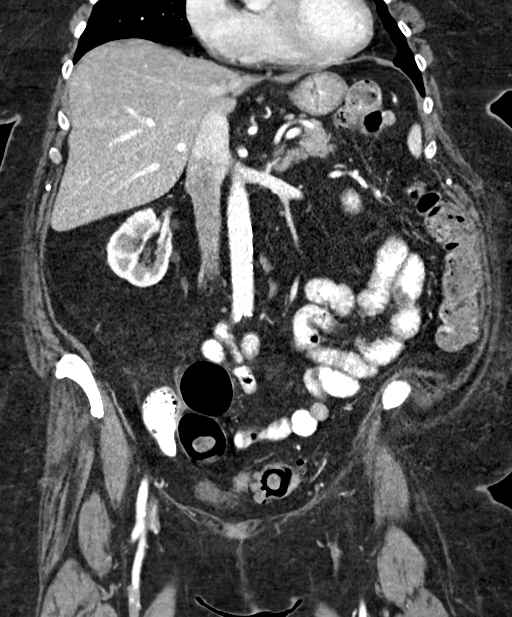
[im 97/174  soft-tissue]
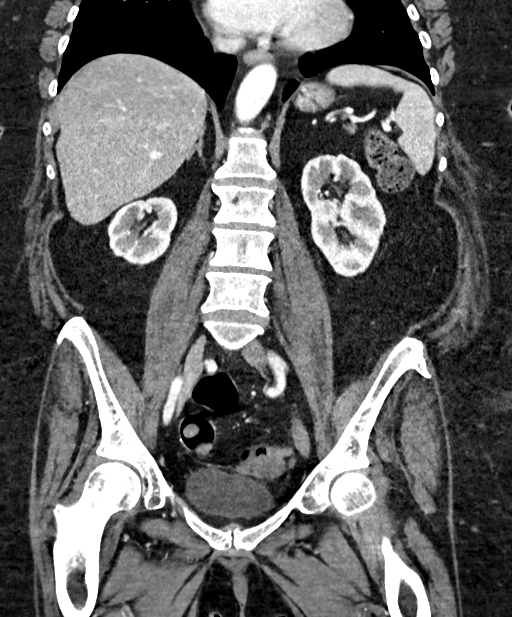

[15 of 46 positions shown; findings below may reference images not displayed]

FINDINGS: Lower chest: The visualized lung bases are clear bilaterally. The
visualized heart and pericardium are unremarkable.

Hepatobiliary: Mild hepatic steatosis. No focal intrahepatic masses.
Gallbladder unremarkable. No intra or extrahepatic biliary ductal
dilation.

Pancreas: Unremarkable

Spleen: Unremarkable

Adrenals/Urinary Tract: The adrenal glands are unremarkable.

The kidneys are normal in position. 2 mm nonobstructing calculus is
seen within the lower pole of the left kidney. There is mild
bilateral renal cortical atrophy noted. Indeterminate 10 mm
low-attenuation lesion arises exophytically from the upper pole of
the left kidney possibly representing a hyperdense renal cyst or a
cystic renal mass. No hydronephrosis. The bladder is unremarkable.

Stomach/Bowel: Severe sigmoid diverticulosis. There is asymmetric
wall thickening involving the anti mesenteric wall of the colon at
the level of the ileocecal junction likely representing the
patient's known primary colonic malignancy. There is pericolonic
soft tissue infiltration in this region most in keeping with
transmural extension of the primary mass. No evidence of
obstruction. Several tiny pericolonic soft tissue nodules are
identified suspicious for pathologic regional adenopathy, by example
at axial image # 60/2, however, the do not meet size criteria for
pathologic enlargement. There is no frankly pathologic adenopathy
within the abdomen and pelvis.

The stomach and small bowel are otherwise unremarkable. The appendix
is absent. Suture related to infraumbilical laparotomy incision is
noted. No free intraperitoneal gas or fluid.

Vascular/Lymphatic: The abdominal vasculature is age-appropriate
with mild aortoiliac atherosclerotic calcification. No aneurysm.

Reproductive: Status post hysterectomy. No adnexal masses.

Other: Rectum unremarkable. Dorsal column stimulator leads extend
into the thoracic spine beyond the margin of the examination. These
leads appear residual as the left subcutaneous battery pack has been
removed.

Musculoskeletal: Degenerative changes are seen within the lumbar
spine. No lytic or blastic bone lesions are identified.
IMPRESSION: 1. Asymmetric wall thickening involving the anti mesenteric wall of
the colon at the level of the ileocecal junction likely representing
the patient's known primary colonic malignancy. There is pericolonic
soft tissue infiltration in this region most in keeping with
transmural extension of the primary mass. No pathologically enlarged
regional adenopathy or evidence of distant metastatic disease.
2. Indeterminate 10 mm low-attenuation lesion arising exophytically
from the upper pole of the left kidney. This could represent a
hyperdense renal cyst or cystic renal mass. Follow-up renal
ultrasound is recommended.
3. Severe sigmoid diverticulosis.

Aortic Atherosclerosis ([id number]-CTJ.J).

## 2022-07-21 ENCOUNTER — Ambulatory Visit
Admission: RE | Admit: 2022-07-21 | Discharge: 2022-07-21 | Disposition: A | Discharge status: Home / Self Care | Payer: Medicare Other | Source: Ambulatory Visit | Attending: Internal Medicine | Admitting: Internal Medicine

## 2022-07-21 DIAGNOSIS — Z1231 Encounter for screening mammogram for malignant neoplasm of breast: Secondary | ICD-10-CM | POA: Insufficient documentation

## 2022-09-06 ENCOUNTER — Telehealth: Payer: Self-pay

## 2022-09-06 NOTE — Telephone Encounter (Signed)
Please contact patient to schedule lab only appt with "[place] lab" in schedule note.  Thanks

## 2022-09-13 ENCOUNTER — Inpatient Hospital Stay: Payer: Medicare Other | Attending: Internal Medicine

## 2022-09-20 ENCOUNTER — Encounter: Payer: Self-pay | Admitting: Internal Medicine

## 2022-10-22 ENCOUNTER — Other Ambulatory Visit: Payer: Self-pay | Admitting: *Deleted

## 2022-10-22 DIAGNOSIS — C182 Malignant neoplasm of ascending colon: Secondary | ICD-10-CM

## 2022-10-25 ENCOUNTER — Inpatient Hospital Stay: Payer: Medicare Other | Attending: Internal Medicine | Admitting: Internal Medicine

## 2022-10-25 ENCOUNTER — Inpatient Hospital Stay: Payer: Medicare Other

## 2022-10-25 ENCOUNTER — Encounter: Payer: Self-pay | Admitting: Internal Medicine

## 2022-10-25 VITALS — BP 176/83 | HR 81 | Temp 98.8°F | Resp 18 | Wt 266.0 lb

## 2022-10-25 DIAGNOSIS — Z79899 Other long term (current) drug therapy: Secondary | ICD-10-CM | POA: Diagnosis not present

## 2022-10-25 DIAGNOSIS — D509 Iron deficiency anemia, unspecified: Secondary | ICD-10-CM | POA: Insufficient documentation

## 2022-10-25 DIAGNOSIS — C182 Malignant neoplasm of ascending colon: Secondary | ICD-10-CM | POA: Diagnosis present

## 2022-10-25 LAB — CBC WITH DIFFERENTIAL/PLATELET
Abs Immature Granulocytes: 0.03 10*3/uL (ref 0.00–0.07)
Basophils Absolute: 0.1 10*3/uL (ref 0.0–0.1)
Basophils Relative: 1 %
Eosinophils Absolute: 0.3 10*3/uL (ref 0.0–0.5)
Eosinophils Relative: 4 %
HCT: 39.6 % (ref 36.0–46.0)
Hemoglobin: 13.4 g/dL (ref 12.0–15.0)
Immature Granulocytes: 0 %
Lymphocytes Relative: 21 %
Lymphs Abs: 1.7 10*3/uL (ref 0.7–4.0)
MCH: 31.2 pg (ref 26.0–34.0)
MCHC: 33.8 g/dL (ref 30.0–36.0)
MCV: 92.1 fL (ref 80.0–100.0)
Monocytes Absolute: 0.5 10*3/uL (ref 0.1–1.0)
Monocytes Relative: 6 %
Neutro Abs: 5.5 10*3/uL (ref 1.7–7.7)
Neutrophils Relative %: 68 %
Platelets: 259 10*3/uL (ref 150–400)
RBC: 4.3 MIL/uL (ref 3.87–5.11)
RDW: 13.7 % (ref 11.5–15.5)
WBC: 8.2 10*3/uL (ref 4.0–10.5)
nRBC: 0 % (ref 0.0–0.2)

## 2022-10-25 LAB — COMPREHENSIVE METABOLIC PANEL
ALT: 13 U/L (ref 0–44)
AST: 19 U/L (ref 15–41)
Albumin: 3.7 g/dL (ref 3.5–5.0)
Alkaline Phosphatase: 80 U/L (ref 38–126)
Anion gap: 10 (ref 5–15)
BUN: 21 mg/dL (ref 8–23)
CO2: 28 mmol/L (ref 22–32)
Calcium: 9.1 mg/dL (ref 8.9–10.3)
Chloride: 101 mmol/L (ref 98–111)
Creatinine, Ser: 0.76 mg/dL (ref 0.44–1.00)
GFR, Estimated: >60 mL/min (ref >60)
Glucose, Bld: 85 mg/dL (ref 70–99)
Potassium: 4.3 mmol/L (ref 3.5–5.1)
Sodium: 139 mmol/L (ref 135–145)
Total Bilirubin: 0.3 mg/dL (ref 0.3–1.2)
Total Protein: 7.4 g/dL (ref 6.5–8.1)

## 2022-10-25 NOTE — Assessment & Plan Note (Addendum)
#  Right-sided colon cancer-stage II; positive for LVI; s MSI-STABLE.  Baseline signetera test- NEGATIVE for circulating tumor DNA. DEC 2023- ct -DNA testing- NEGATIVE.  No adjuvant chemotherapy. stable  #Signatera testing negative in the interim.  S/p testing this morning pending.  S/p colonoscopy in August 2022- serrated dysplastic polyp; repeat colo-3 years [aug 2025].    #Iron deficient anemia-hemoglobin 9-10; secondary Hx of colon ca; today Hb 13.4; iron pills TIW;   # DISPOSITION: # in 12 months- MD; labs- cbc/cmp;CEA -Dr.B  Cc; Dr.Johnston/Byrnett.

## 2022-10-25 NOTE — Progress Notes (Signed)
[facility] NOTE  Patient Care Team: Baxter Hire, MD as PCP - General (Internal Medicine) Cammie Sickle, MD as Consulting Physician (Internal Medicine) Bary Castilla, Forest Gleason, MD as Consulting Physician (General Surgery) Clent Jacks, RN as Oncology Nurse Navigator  CHIEF COMPLAINTS/PURPOSE OF CONSULTATION: COLON CANCER  #  Oncology History Overview Note  # AUG 2021-Colon cancer -s/p right hemicolectomy; Dr. Tollie Pizza; moderately differentiated; pT3;pN0-0/31 LN; STage II [no tumor perforation; negative margins; moderate differentiated; positive for LVI; MSI stable]; signatera- SEP 2021- NEGATIVE FOR CT-DNA  #No adjuvant therapy   # Chronic back pain [pain clinic- on oxycodone]; PN- ? Chronic back pain.   # SURVIVORSHIP:   # GENETICS: NONE  DIAGNOSIS: colon cancer- right  STAGE:   II      ;  GOALS: cure  CURRENT/MOST RECENT THERAPY : Surveillance   Cancer of right colon ([place])  05/26/2020 Initial Diagnosis   Cancer of right colon ([place])     HISTORY OF PRESENTING ILLNESS: alone; walks with a cane.  Annette Mccullough 77 y.o.  female patient with above diagnosis of stage II right-sided colon cancer is here for follow-up/review the results.  Patient denies new problems/concerns today. In the interim patient had a colonoscopy-serrated polyp. Patient has had Signatera testing in the interim-negative.  Patient denies any blood in stools or black-colored stools.  No abdominal pain no nausea no vomiting.    Review of Systems  Constitutional:  Negative for chills, diaphoresis, fever and weight loss.  HENT:  Negative for nosebleeds and sore throat.   Eyes:  Negative for double vision.  Respiratory:  Negative for cough, hemoptysis, sputum production, shortness of breath and wheezing.   Cardiovascular:  Negative for chest pain, palpitations, orthopnea and leg swelling.  Gastrointestinal:  Negative for abdominal pain, blood in stool, constipation,  diarrhea, heartburn, melena, nausea and vomiting.  Genitourinary:  Negative for dysuria, frequency and urgency.  Musculoskeletal:  Positive for back pain and joint pain.  Skin: Negative.  Negative for itching and rash.  Neurological:  Positive for tingling. Negative for dizziness, focal weakness, weakness and headaches.  Endo/Heme/Allergies:  Does not bruise/bleed easily.  Psychiatric/Behavioral:  Negative for depression. The patient is not nervous/anxious and does not have insomnia.      MEDICAL HISTORY:  Past Medical History:  Diagnosis Date   Arthritis    multiple areas   Blood transfusion    59- post childbirth & /w joint replacement    Chronic kidney disease    renal calculi /w pregnancy   Colon cancer, ascending ([place]) 06/01/2020   6 cm, T3,NO, low risk MSI.   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- some weakness remains     SURGICAL HISTORY: Past Surgical History:  Procedure Laterality Date   ABDOMINAL HYSTERECTOMY     APPENDECTOMY  CERVICAL FUSION     2011   CESAREAN SECTION     2 times    COLONOSCOPY WITH PROPOFOL N/A 05/07/2020   Procedure: COLONOSCOPY WITH PROPOFOL;  Surgeon: Robert Bellow, MD;  Location: [facility] ENDOSCOPY;  Service: Endoscopy;  Laterality: N/A;   COLONOSCOPY WITH PROPOFOL N/A 05/27/2021   Procedure: COLONOSCOPY WITH PROPOFOL;  Surgeon: Robert Bellow, MD;  Location: [facility] ENDOSCOPY;  Service: Endoscopy;  Laterality: N/A;   CYST EXCISION  Right    hand, and both ovaries   ESOPHAGOGASTRODUODENOSCOPY N/A 05/07/2020   Procedure: ESOPHAGOGASTRODUODENOSCOPY (EGD);  Surgeon: Robert Bellow, MD;  Location: [facility] END[facility]COPY;  Service: Endoscopy;  Laterality: N/A;   EYE SURGERY     bilateral cataract surgery    JOINT REPLACEMENT     1999- R knee   LAPAROSCOPIC RIGHT COLECTOMY Right 05/26/2020   Procedure: LAPAROSCOPIC RIGHT HEMI-COLECTOMY;  Surgeon: Robert Bellow, MD;  Location: [facility];  Service: General;  Laterality: Right;   LUMBAR WOUND DEBRIDEMENT  09/15/2011   Procedure: LUMBAR WOUND DEBRIDEMENT;  Surgeon: Dahlia Bailiff;  Location: [place];  Service: Orthopedics;  Laterality: N/A;  REPEAT I&D AND WOUND CLOSURE OF SPINAL WOUND   OVARIAN CYST REMOVAL     SPINAL CORD STIMULATOR BATTERY EXCHANGE Left 02/07/2013   Procedure: REMOVAL OF HARDWARE (BATTERY REMOVAL);  Surgeon: Melina Schools, MD;  Location: [place];  Service: Orthopedics;  Laterality: Left;  removal of spinal cord stimulator battery   SPINAL CORD STIMULATOR INSERTION  08/25/2011   Procedure: LUMBAR SPINAL CORD STIMULATOR INSERTION;  Surgeon: Dahlia Bailiff;  Location: [place];  Service: Orthopedics;  Laterality: N/A;   TONSILLECTOMY     as a child   TOTAL KNEE REVISION Right 01/21/2016   Procedure: RIGHT TOTAL KNEE REVISION;  Surgeon: Gaynelle Arabian, MD;  Location: [facility];  Service: Orthopedics;  Laterality: Right;   TRIGGER FINGER RELEASE Right    thumb    SOCIAL HISTORY: Social History   Socioeconomic History   Marital status: Widowed    Spouse name: Not on file   Number of children: Not on file   Years of education: Not on file   Highest education level: Not on file  Occupational History   Not on file  Tobacco Use   Smoking status: Former    Types: Cigarettes    Quit date: 08/17/2004    Years since quitting: 18.2   Smokeless tobacco: Never  Vaping Use   Vaping Use: Never used  Substance and Sexual Activity   Alcohol use: Yes    Comment: rare   Drug  use: No   Sexual activity: Not on file  Other Topics Concern   Not on file  Social History Narrative   Lives in [place]; self; 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    Social Determinants of Health   Financial Resource Strain: Not on file  Food Insecurity: Not on file  Transportation Needs: Not on file  Physical Activity: Not on file  Stress: Not on file  Social Connections: Not on file  Intimate Partner Violence: Not on file    FAMILY HISTORY: Family History  Problem Relation Age of Onset   Anesthesia problems Sister    Breast cancer Sister 75   Breast cancer Mother 35   Breast cancer Maternal Grandmother 61    ALLERGIES:  is allergic to eggs or egg-derived products and tape.  MEDICATIONS:  Current Outpatient Medications  Medication Sig Dispense Refill  aspirin EC 81 MG tablet Take 81 mg by mouth daily. Swallow whole.     cyclobenzaprine (FLEXERIL) 10 MG tablet Take 10 mg by mouth 3 (three) times daily as needed for muscle spasms.     docusate sodium (COLACE) 100 MG capsule Take 100 mg by mouth 2 (two) times daily.      DULoxetine (CYMBALTA) 60 MG capsule Take 60 mg by mouth daily.     gabapentin (NEURONTIN) 100 MG capsule Take 100 mg by mouth 3 (three) times daily.     hydroxypropyl methylcellulose (ISOPTO TEARS) 2.5 % ophthalmic solution Place 2 drops into both eyes 4 (four) times daily as needed (for dry eyes).      lisinopril (ZESTRIL) 20 MG tablet Take 1 tablet (20 mg total) by mouth daily. 1 tablet 1   Multiple Vitamin (MULTIVITAMIN) tablet Take 1 tablet by mouth daily.     pantoprazole (PROTONIX) 40 MG tablet Take 40 mg by mouth daily before breakfast.     diphenhydrAMINE (BENADRYL) 25 MG tablet Take 25 mg by mouth daily as needed for allergies.     ferrous sulfate 325 (65 FE) MG tablet Take 325 mg by mouth. Take with 4 oz of orange juice -takes once q3days     hydrochlorothiazide (HYDRODIURIL) 25 MG tablet Take 1 tablet  (25 mg total) by mouth every morning. 1 tablet 1   oxyCODONE-acetaminophen (PERCOCET) 10-325 MG tablet Take 1 tablet by mouth 4 (four) times daily.     No current facility-administered medications for this visit.      Marland Kitchen  PHYSICAL EXAMINATION: ECOG PERFORMANCE STATUS: 0 - Asymptomatic  Vitals:   10/25/22 0900  BP: (!) 176/83  Pulse: 81  Resp: 18  Temp: 98.8 F (37.1 C)  SpO2: 99%   Filed Weights   10/25/22 0900  Weight: 266 lb (120.7 kg)    Physical Exam HENT:     Head: Normocephalic and atraumatic.     Mouth/Throat:     Pharynx: No oropharyngeal exudate.  Eyes:     Pupils: Pupils are equal, round, and reactive to light.  Cardiovascular:     Rate and Rhythm: Normal rate and regular rhythm.  Pulmonary:     Effort: Pulmonary effort is normal. No respiratory distress.     Breath sounds: Normal breath sounds. No wheezing.  Abdominal:     General: Bowel sounds are normal. There is no distension.     Palpations: Abdomen is soft. There is no mass.     Tenderness: There is no abdominal tenderness. There is no guarding or rebound.  Musculoskeletal:        General: No tenderness. Normal range of motion.     Cervical back: Normal range of motion and neck supple.  Skin:    General: Skin is warm.  Neurological:     Mental Status: She is alert and oriented to person, place, and time.  Psychiatric:        Mood and Affect: Affect normal.      LABORATORY DATA:  I have reviewed the data as listed Lab Results  Component Value Date   WBC 8.2 10/25/2022   HGB 13.4 10/25/2022   HCT 39.6 10/25/2022   MCV 92.1 10/25/2022   PLT 259 10/25/2022   Recent Labs    10/25/22 0920  NA 139  K 4.3  CL 101  CO2 28  GLUCOSE 85  BUN 21  CREATININE 0.76  CALCIUM 9.1  GFRNONAA >60  PROT 7.4  ALBUMIN 3.7  AST  19  ALT 13  ALKPHOS 80  BILITOT 0.3    RADIOGRAPHIC STUDIES: I have personally reviewed the radiological images as listed and agreed with the findings in the  report. No results found.  ASSESSMENT & PLAN:   Cancer of right colon ([place]) # Right-sided colon cancer-stage II; positive for LVI; s MSI-STABLE.  Baseline signetera test- NEGATIVE for circulating tumor DNA. DEC 2023- ct -DNA testing- NEGATIVE.  No adjuvant chemotherapy. stable  #Signatera testing negative in the interim.  S/p testing this morning pending.  S/p colonoscopy in August 2022- serrated dysplastic polyp; repeat colo-3 years [aug 2025].    #Iron deficient anemia-hemoglobin 9-10; secondary Hx of colon ca; today Hb 13.4; iron pills TIW;   # DISPOSITION: # in 12 months- MD; labs- cbc/cmp;CEA -Dr.B  Cc; Dr.Johnston/[place].   All questions were answered. The patient knows to call the clinic with any problems, questions or concerns.    Cammie Sickle, MD 10/25/2022 10:24 AM

## 2022-10-25 NOTE — Progress Notes (Signed)
Patient denies new problems/concerns today.   °

## 2022-10-25 NOTE — Addendum Note (Signed)
Addended by: Sofie Rower A on: 10/25/2022 10:45 AM   Modules accepted: Orders

## 2022-10-26 LAB — CEA: CEA: 3.6 ng/mL (ref 0.0–4.7)

## 2022-10-27 ENCOUNTER — Ambulatory Visit: Payer: Medicare Other | Admitting: Internal Medicine

## 2022-10-27 ENCOUNTER — Other Ambulatory Visit: Payer: Medicare Other

## 2023-01-27 ENCOUNTER — Other Ambulatory Visit: Payer: Self-pay | Admitting: *Deleted

## 2023-01-28 ENCOUNTER — Other Ambulatory Visit: Payer: Self-pay | Admitting: *Deleted

## 2023-01-28 DIAGNOSIS — C182 Malignant neoplasm of ascending colon: Secondary | ICD-10-CM

## 2023-02-10 ENCOUNTER — Inpatient Hospital Stay: Payer: Medicare Other | Attending: Internal Medicine

## 2023-02-10 DIAGNOSIS — C182 Malignant neoplasm of ascending colon: Secondary | ICD-10-CM

## 2023-02-21 LAB — SIGNATERA
SIGNATERA MTM READOUT: 0 MTM/ml
SIGNATERA TEST RESULT: NEGATIVE

## 2023-03-02 ENCOUNTER — Inpatient Hospital Stay: Payer: Medicare Other

## 2023-07-27 ENCOUNTER — Telehealth: Payer: Self-pay | Admitting: Internal Medicine

## 2023-07-27 NOTE — Telephone Encounter (Signed)
Patient called to cancel all appointments due to medicare not covering the testing. I have cancelled appointments.

## 2023-07-28 ENCOUNTER — Other Ambulatory Visit: Payer: Self-pay

## 2023-07-28 ENCOUNTER — Emergency Department: Payer: Medicare Other

## 2023-07-28 ENCOUNTER — Emergency Department
Admission: EM | Admit: 2023-07-28 | Discharge: 2023-07-28 | Disposition: A | Discharge status: Home / Self Care | Payer: Medicare Other | Attending: Emergency Medicine | Admitting: Emergency Medicine

## 2023-07-28 DIAGNOSIS — I10 Essential (primary) hypertension: Secondary | ICD-10-CM | POA: Insufficient documentation

## 2023-07-28 DIAGNOSIS — R519 Headache, unspecified: Secondary | ICD-10-CM

## 2023-07-28 LAB — BASIC METABOLIC PANEL
Anion gap: 11 (ref 5–15)
BUN: 20 mg/dL (ref 8–23)
CO2: 24 mmol/L (ref 22–32)
Calcium: 9.3 mg/dL (ref 8.9–10.3)
Chloride: 102 mmol/L (ref 98–111)
Creatinine, Ser: 0.83 mg/dL (ref 0.44–1.00)
GFR, Estimated: >60 mL/min (ref >60)
Glucose, Bld: 104 mg/dL — ABNORMAL HIGH (ref 70–99)
Potassium: 4 mmol/L (ref 3.5–5.1)
Sodium: 137 mmol/L (ref 135–145)

## 2023-07-28 LAB — CBC
HCT: 41 % (ref 36.0–46.0)
Hemoglobin: 13.5 g/dL (ref 12.0–15.0)
MCH: 31 pg (ref 26.0–34.0)
MCHC: 32.9 g/dL (ref 30.0–36.0)
MCV: 94 fL (ref 80.0–100.0)
Platelets: 246 10*3/uL (ref 150–400)
RBC: 4.36 MIL/uL (ref 3.87–5.11)
RDW: 13.5 % (ref 11.5–15.5)
WBC: 7.9 10*3/uL (ref 4.0–10.5)
nRBC: 0 % (ref 0.0–0.2)

## 2023-07-28 NOTE — Discharge Instructions (Addendum)
You are seen in the emergency department for elevated blood pressure and a headache.  Your lab work was overall normal.  You had a CT scan done of your head that was normal and had no emergency findings to account for your headache today.  You had no symptoms of a stroke today.  Your blood pressure improved in the emergency department without any further blood pressure medications.  Call and follow-up closely with your primary care physician.  You need blood pressure rechecked.  Return to the emergency department for any worsening symptoms.  Thank you for choosing [facility] for your health care, it was my pleasure to care for you today!  Corena Herter, MD

## 2023-07-28 NOTE — ED Triage Notes (Signed)
Pt presents to ED with c/o of hypertension, pt states she took an extra lisinopril pill and PCP sent her here for further val. Pt states started new coffee.

## 2023-07-28 NOTE — ED Provider Notes (Signed)
Reviewed CT head with age-indeterminate infarcts that do not correspond with patient's symptoms today.  Discussed with Dr. Bing Neighbors of neurologist who recommends primary care follow-up.  Discharge.   Pilar Jarvis, MD 07/28/23 1630

## 2023-07-28 NOTE — ED Provider Notes (Addendum)
[facility] Provider Note    Event Date/Time   First MD Initiated Contact with Patient 07/28/23 1240     (approximate)   History   Hypertension   HPI  Annette Mccullough is a 77 y.o. female past medical history significant for hypertension who presents to the emergency department for hypertension and a headache.  Called her primary care physician today for ongoing elevated blood pressure was told to come to the emergency department for evaluation.  Patient takes lisinopril 20 mg but states that she took an extra dose.  Started having elevated blood pressure over the past 2 days.  Complaining of a headache and a throbbing face pain.  No falls or trauma.  Denies any chest pain or shortness of breath.  Does state that she started a new coffee with mushrooms this past week.  This is the only other new thing that she is done.  No recent changes to her home medications.  Denies change in vision.  Denies slurring of speech or trouble swallowing.  Denies any new extremity numbness or weakness.  Prior CVA.  Denies any new symptoms.     Physical Exam   Triage Vital Signs: ED Triage Vitals [07/28/23 1201]  Encounter Vitals Group     BP (!) 163/80     Systolic BP Percentile      Diastolic BP Percentile      Pulse Rate 78     Resp 18     Temp 97.8 F (36.6 C)     Temp Source Oral     SpO2 92 %     Weight 262 lb (118.8 kg)     Height 5\' 7"  (1.702 m)     Head Circumference      Peak Flow      Pain Score 0     Pain Loc      Pain Education      Exclude from Growth Chart     Most recent vital signs: Vitals:   07/28/23 1201 07/28/23 1315  BP: (!) 163/80 (!) 151/79  Pulse: 78   Resp: 18   Temp: 97.8 F (36.6 C)   SpO2: 92%     Physical Exam Constitutional:      Appearance: She is well-developed.  HENT:     Head: Atraumatic.  Eyes:     Conjunctiva/sclera: Conjunctivae normal.  Cardiovascular:     Rate and Rhythm: Regular rhythm.  Pulmonary:      Effort: No respiratory distress.  Abdominal:     General: There is no distension.  Musculoskeletal:        General: Normal range of motion.     Cervical back: Normal range of motion.  Skin:    General: Skin is warm.  Neurological:     Mental Status: She is alert. Mental status is at baseline.     GCS: GCS eye subscore is 4. GCS verbal subscore is 5. GCS motor subscore is 6.     Cranial Nerves: Cranial nerves 2-12 are intact.     Sensory: Sensation is intact.     Motor: Motor function is intact.     Comments: 5/5 strength bilateral upper and lower extremities.  Sensation intact to bilateral upper and lower extremities.     IMPRESSION / MDM / ASSESSMENT AND PLAN / ED COURSE  I reviewed the triage vital signs and the nursing notes.  Differential diagnosis including hypertension, acute kidney injury, intracranial hemorrhage, electrolyte abnormality  EKG  I,  Corena Herter, the attending physician, personally viewed and interpreted this ECG.   Rate: Normal  Rhythm: Normal sinus  Axis: Normal  Intervals: Incomplete right bundle branch block.  ST&T Change: None  No tachycardic or bradycardic dysrhythmias while on cardiac telemetry.  RADIOLOGY I independently reviewed imaging, my interpretation of imaging: CT scan of the head without signs of intracranial hemorrhage or infarction.  Read currently pending  CT scan of the head was read as age-indeterminate bilateral thalamic infarcts.  LABS (all labs ordered are listed, but only abnormal results are displayed) Labs interpreted as -    Labs Reviewed  BASIC METABOLIC PANEL - Abnormal; Notable for the following components:      Result Value   Glucose, Bld 104 (*)    All other components within normal limits  CBC     MDM  Lab work with no significant leukocytosis or anemia.  Creatinine is at baseline.  No significant electrolyte abnormality.  Offered Tylenol for pain control however patient declined.  On reevaluation only  having a mild headache.  States she is feeling much better.  Blood pressure improved on its own and is currently 150 systolic.  Has a nonfocal neurologic exam, clinical picture is not consistent with CVA.  No chest pain or shortness of breath, doubt ACS, dissection or pulmonary embolism.  If CT scan of the head is read as no acute findings plan to discharge home with outpatient follow-up with primary care provider.  Patient with hypertension but has a primary care physician who she follows with and manages her medications.  Discussed follow-up with primary care physician in the next 1 week for blood pressure recheck and determine if she needed any further antihypertensive medications.  CT scan of the head was read as age-indeterminate bilateral thalamic infarcts.  Patient has had a prior CVA however the MRI is not in her system.  Patient is unable to get an MRI secondary to having the wires of a nerve stimulator in her back.  Called to discuss the patient's case with Dr. Selina Cooley.     PROCEDURES:  Critical Care performed: No  Procedures  Patient's presentation is most consistent with acute presentation with potential threat to life or bodily function.   MEDICATIONS ORDERED IN ED: Medications - No data to display  FINAL CLINICAL IMPRESSION(S) / ED DIAGNOSES   Final diagnoses:  Uncontrolled hypertension  Acute nonintractable headache, unspecified headache type     Rx / DC Orders   ED Discharge Orders     None        Note:  This document was prepared using Dragon voice recognition software and may include unintentional dictation errors.   Corena Herter, MD 07/28/23 1533    Corena Herter, MD 07/28/23 [phone]

## 2023-08-15 ENCOUNTER — Other Ambulatory Visit: Payer: Medicare Other

## 2023-08-29 IMAGING — MG MM DIGITAL SCREENING BILAT W/ TOMO AND CAD
8 of 14 series · 8 of 40 positions shown · non-contrast
Comparison: Previous exam(s).

CLINICAL DATA: Screening.

EXAM:
DIGITAL SCREENING BILATERAL MAMMOGRAM WITH TOMOSYNTHESIS AND CAD
TECHNIQUE: Bilateral screening digital craniocaudal and mediolateral oblique
mammograms were obtained. Bilateral screening digital breast
tomosynthesis was performed. The images were evaluated with
computer-aided detection.

[R CC synth-2D]
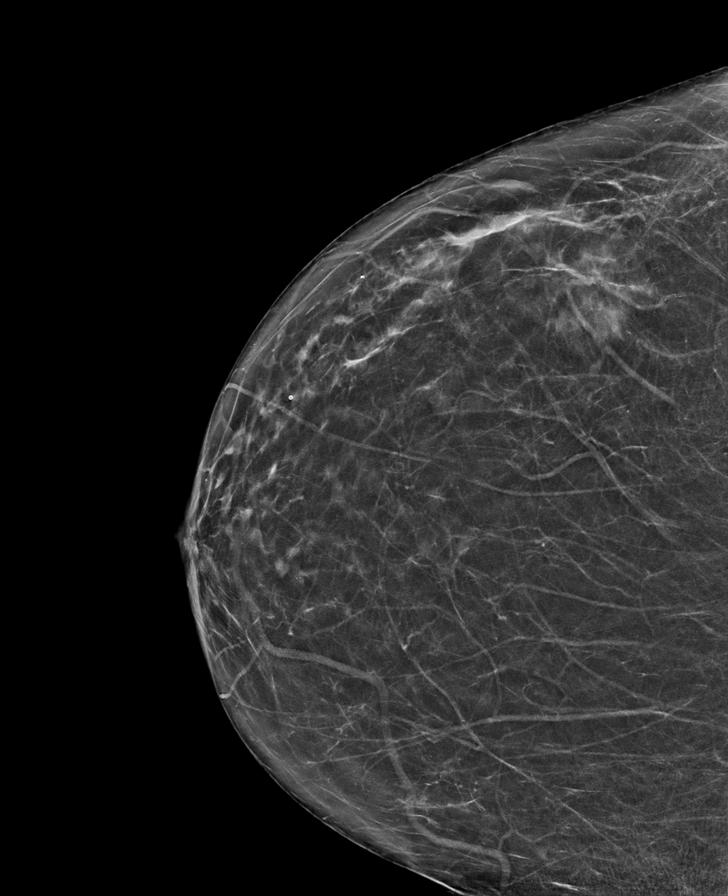

[L MLO synth-2D]
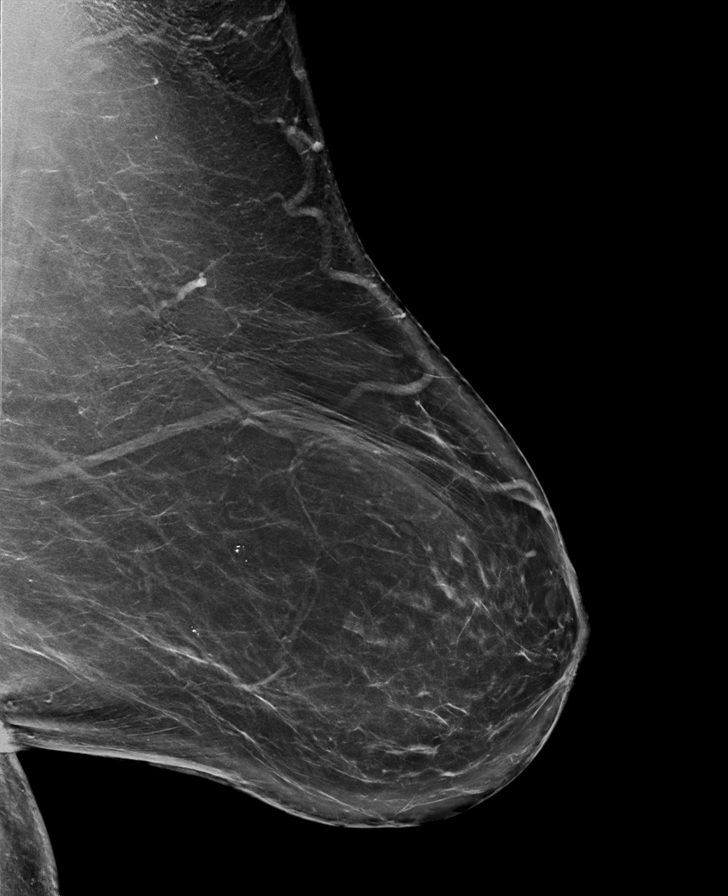

[R MLO synth-2D (1 of 2)]
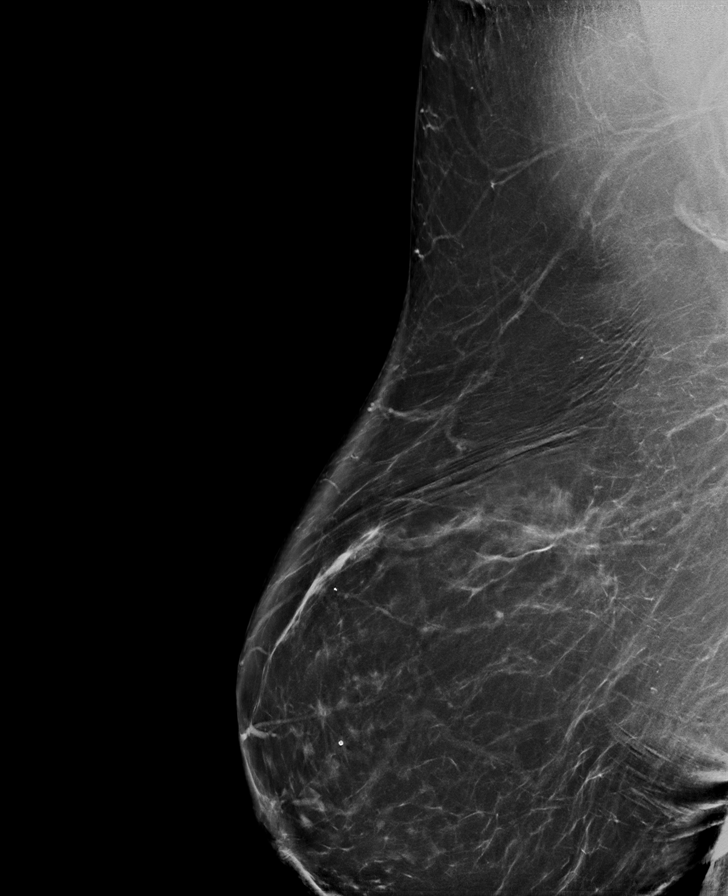

[R MLO synth-2D (2 of 2)]
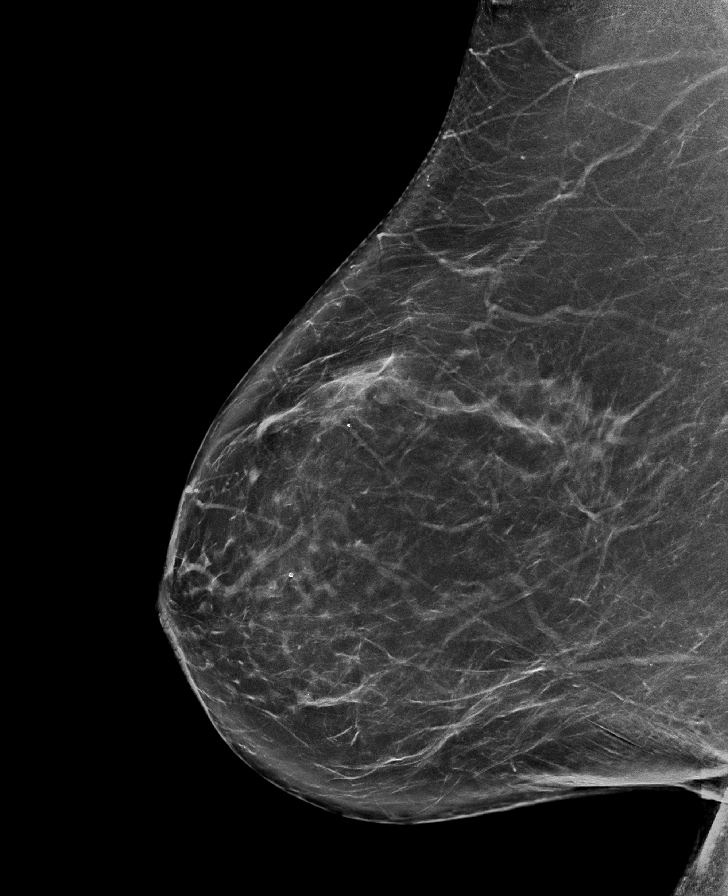

[R CV synth-2D]
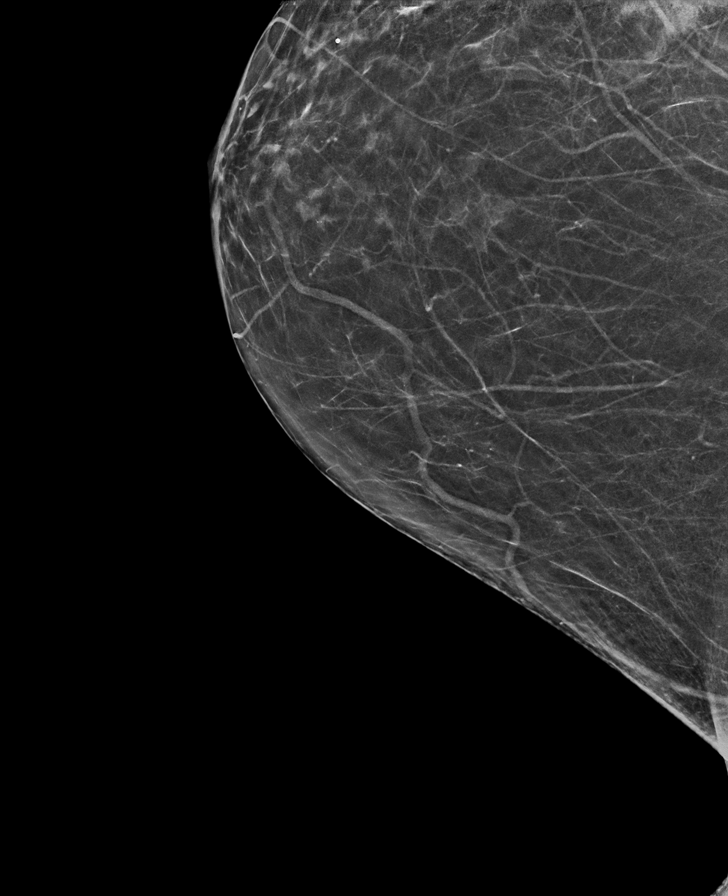

[L CC synth-2D]
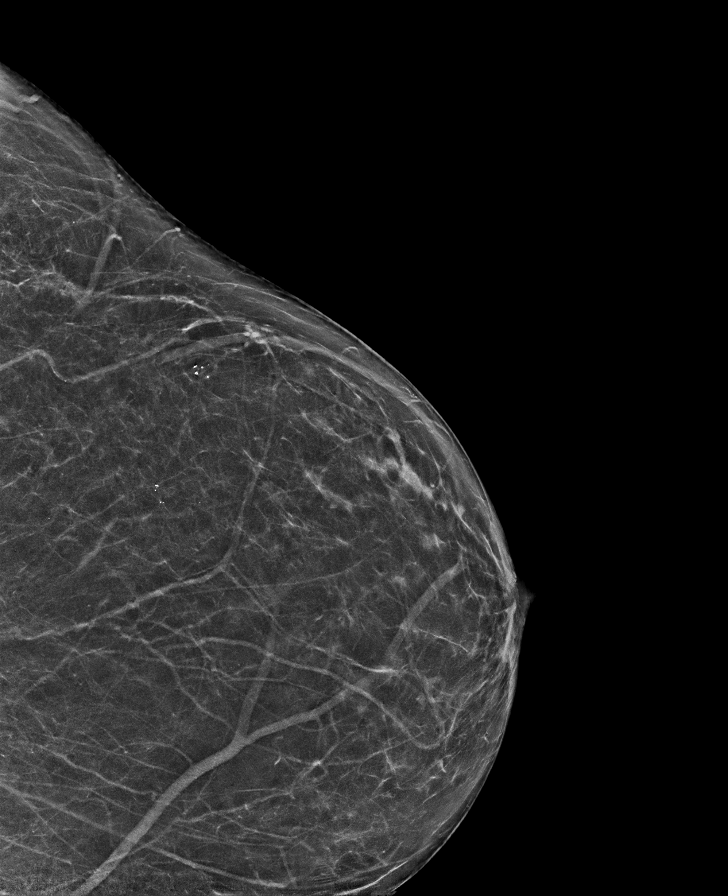

[L CV synth-2D]
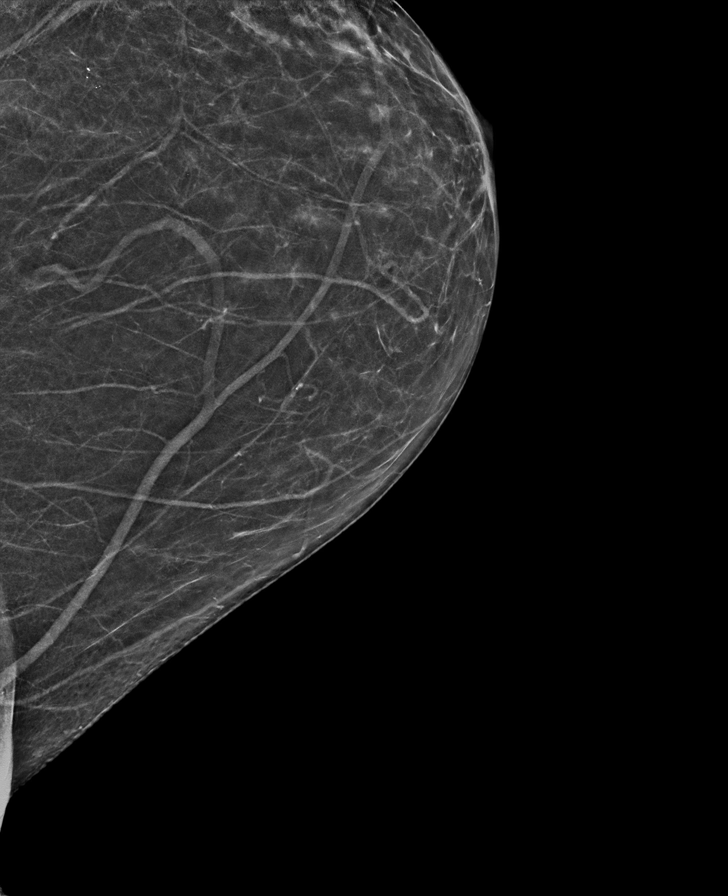

[L CC tomo · tomo slice 35/70.0]
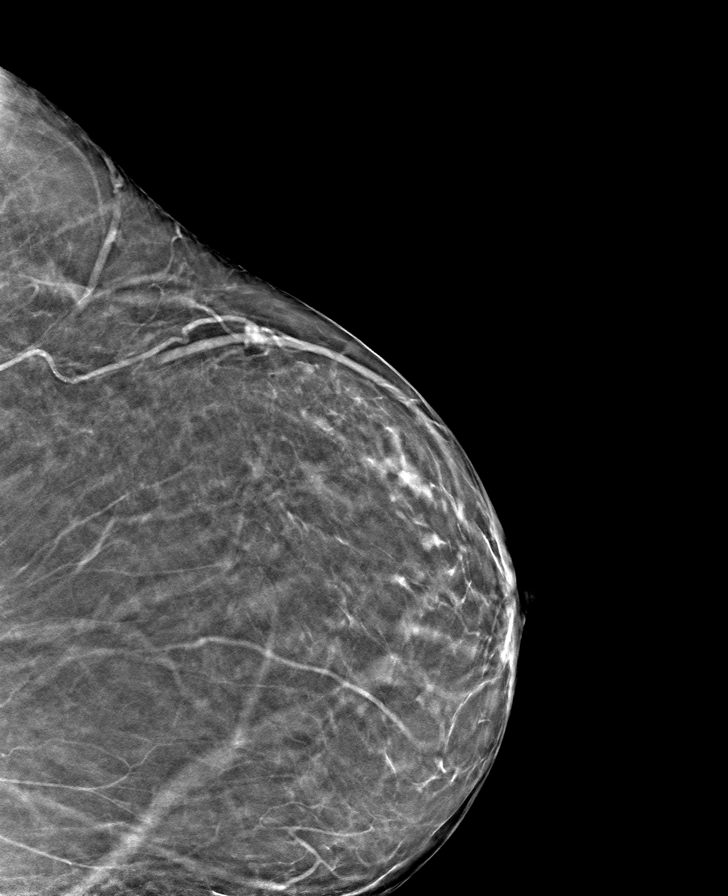

[8 of 40 positions shown; findings below may reference images not displayed]

ACR Breast Density Category b: There are scattered areas of
fibroglandular density.
FINDINGS: There are no findings suspicious for malignancy.
IMPRESSION: No mammographic evidence of malignancy. A result letter of this
screening mammogram will be mailed directly to the patient.

RECOMMENDATION:
Screening mammogram in one year. (Code:51-O-LD2)

BI-RADS CATEGORY  1: Negative.

## 2023-10-26 ENCOUNTER — Ambulatory Visit: Payer: Medicare Other | Admitting: Internal Medicine

## 2023-10-26 ENCOUNTER — Other Ambulatory Visit: Payer: Medicare Other

## 2024-08-01 ENCOUNTER — Encounter: Payer: Self-pay | Admitting: *Deleted

## 2024-08-01 LAB — GENETIC SCREENING ORDER

## 2024-08-07 ENCOUNTER — Encounter
Admission: RE | Disposition: A | Discharge status: Home / Self Care | Payer: Self-pay | Source: Home / Self Care | Attending: Gastroenterology

## 2024-08-07 ENCOUNTER — Ambulatory Visit: Admitting: Anesthesiology

## 2024-08-07 ENCOUNTER — Other Ambulatory Visit: Payer: Self-pay

## 2024-08-07 ENCOUNTER — Ambulatory Visit
Admission: RE | Admit: 2024-08-07 | Discharge: 2024-08-07 | Disposition: A | Discharge status: Home / Self Care | Attending: Gastroenterology | Admitting: Gastroenterology

## 2024-08-07 DIAGNOSIS — Z85038 Personal history of other malignant neoplasm of large intestine: Secondary | ICD-10-CM | POA: Insufficient documentation

## 2024-08-07 DIAGNOSIS — Z87891 Personal history of nicotine dependence: Secondary | ICD-10-CM | POA: Insufficient documentation

## 2024-08-07 DIAGNOSIS — K5730 Diverticulosis of large intestine without perforation or abscess without bleeding: Secondary | ICD-10-CM | POA: Diagnosis not present

## 2024-08-07 DIAGNOSIS — K640 First degree hemorrhoids: Secondary | ICD-10-CM | POA: Diagnosis not present

## 2024-08-07 DIAGNOSIS — Z1211 Encounter for screening for malignant neoplasm of colon: Secondary | ICD-10-CM | POA: Diagnosis present

## 2024-08-07 DIAGNOSIS — Z9049 Acquired absence of other specified parts of digestive tract: Secondary | ICD-10-CM | POA: Insufficient documentation

## 2024-08-07 DIAGNOSIS — I129 Hypertensive chronic kidney disease with stage 1 through stage 4 chronic kidney disease, or unspecified chronic kidney disease: Secondary | ICD-10-CM | POA: Diagnosis not present

## 2024-08-07 DIAGNOSIS — I69354 Hemiplegia and hemiparesis following cerebral infarction affecting left non-dominant side: Secondary | ICD-10-CM | POA: Diagnosis not present

## 2024-08-07 DIAGNOSIS — Z90710 Acquired absence of both cervix and uterus: Secondary | ICD-10-CM | POA: Diagnosis not present

## 2024-08-07 DIAGNOSIS — N189 Chronic kidney disease, unspecified: Secondary | ICD-10-CM | POA: Diagnosis not present

## 2024-08-07 DIAGNOSIS — D122 Benign neoplasm of ascending colon: Secondary | ICD-10-CM | POA: Diagnosis not present

## 2024-08-07 HISTORY — PX: POLYPECTOMY: SHX149

## 2024-08-07 HISTORY — PX: FIDUCIAL MARKER PLACEMENT: SHX6858

## 2024-08-07 HISTORY — PX: HEMOSTASIS CLIP PLACEMENT: SHX6857

## 2024-08-07 HISTORY — PX: COLONOSCOPY: SHX5424

## 2024-08-07 SURGERY — COLONOSCOPY
Anesthesia: General

## 2024-08-07 MED ORDER — SPOT INK MARKER SYRINGE KIT
PACK | SUBMUCOSAL | Status: DC | PRN
  Administered 2024-08-07: .75 mL via SUBMUCOSAL

## 2024-08-07 MED ORDER — DEXMEDETOMIDINE HCL IN NACL 80 MCG/20ML IV SOLN
INTRAVENOUS | Status: DC | PRN
  Administered 2024-08-07: 8 ug via INTRAVENOUS
  Administered 2024-08-07: 12 ug via INTRAVENOUS

## 2024-08-07 MED ORDER — LIDOCAINE HCL (CARDIAC) PF 100 MG/5ML IV SOSY
PREFILLED_SYRINGE | INTRAVENOUS | Status: DC | PRN
  Administered 2024-08-07: 80 mg via INTRAVENOUS

## 2024-08-07 MED ORDER — PROPOFOL 10 MG/ML IV BOLUS
INTRAVENOUS | Status: DC | PRN
  Administered 2024-08-07: 30 mg via INTRAVENOUS
  Administered 2024-08-07: 50 mg via INTRAVENOUS
  Administered 2024-08-07: 20 mg via INTRAVENOUS

## 2024-08-07 MED ORDER — PROPOFOL 500 MG/50ML IV EMUL
INTRAVENOUS | Status: DC | PRN
  Administered 2024-08-07: 75 ug/kg/min via INTRAVENOUS

## 2024-08-07 MED ORDER — SODIUM CHLORIDE 0.9 % IV SOLN: INTRAVENOUS | Status: DC

## 2024-08-07 NOTE — Op Note (Signed)
 [facility]enterology Patient Name: Annette Mccullough Procedure Date: 08/07/2024 11:53 AM MRN: [id number] Account #: [id number] Date of Birth: 1946/03/02 Admit Type: Outpatient Age: 78 Room: [facility] ENDO ROOM 1 Gender: Female Note Status: Finalized Instrument Name: Colon Scope [id number] Procedure:             Colonoscopy Indications:           High risk colon cancer surveillance: Personal history                         of colon cancer Providers:             Ole Schick MD, MD Referring MD:          Annette CHARM Rower, MD (Referring MD) Medicines:             Monitored Anesthesia Care Complications:         No immediate complications. Estimated blood loss:                         Minimal. Procedure:             Pre-Anesthesia Assessment:                        - Prior to the procedure, a History and Physical was                         performed, and patient medications and allergies were                         reviewed. The patient is competent. The risks and                         benefits of the procedure and the sedation options and                         risks were discussed with the patient. All questions                         were answered and informed consent was obtained.                         Patient identification and proposed procedure were                         verified by the physician, the nurse, the                         anesthesiologist, the anesthetist and the technician                         in the endoscopy suite. Mental Status Examination:                         alert and oriented. Airway Examination: normal                         oropharyngeal airway and neck mobility. Respiratory  Examination: clear to auscultation. CV Examination:                         normal. Prophylactic Antibiotics: The patient does not                         require prophylactic antibiotics. Prior                          Anticoagulants: The patient has taken no anticoagulant                         or antiplatelet agents. ASA Grade Assessment: III - A                         patient with severe systemic disease. After reviewing                         the risks and benefits, the patient was deemed in                         satisfactory condition to undergo the procedure. The                         anesthesia plan was to use monitored anesthesia care                         (MAC). Immediately prior to administration of                         medications, the patient was re-assessed for adequacy                         to receive sedatives. The heart rate, respiratory                         rate, oxygen saturations, blood pressure, adequacy of                         pulmonary ventilation, and response to care were                         monitored throughout the procedure. The physical                         status of the patient was re-assessed after the                         procedure.                        After obtaining informed consent, the colonoscope was                         passed under direct vision. Throughout the procedure,                         the patient's blood pressure, pulse, and oxygen  saturations were monitored continuously. The                         Colonoscope was introduced through the anus and                         advanced to the the ileocolonic anastomosis. The                         colonoscopy was somewhat difficult due to a redundant                         colon and a tortuous colon. Successful completion of                         the procedure was aided by applying abdominal                         pressure. The patient tolerated the procedure well.                         The quality of the bowel preparation was good. The                         rectum was photographed. Findings:      The perianal and digital rectal examinations were  normal.      A 15 mm polyp was found in the ascending colon. The polyp was       mucous-capped. Preparations were made for mucosal resection. Demarcation       of the lesion was performed with narrow band imaging to clearly identify       the boundaries of the lesion. Eleview was injected to raise the lesion.       Snare mucosal resection was performed. Resection and retrieval were       complete. Resected tissue There was a small amount of residual polyp at       one margin that was removed. To close a defect after polypectomy, three       hemostatic clips were successfully placed. There was no bleeding during,       or at the end, of the procedure. Area was tattooed with an injection of       0.5 mL of India ink.      Multiple small-mouthed diverticula were found in the sigmoid colon.      Internal hemorrhoids were found during retroflexion. The hemorrhoids       were Grade I (internal hemorrhoids that do not prolapse).      The exam was otherwise without abnormality on direct and retroflexion       views. Impression:            - One 15 mm polyp in the ascending colon, removed with                         mucosal resection. Resected and retrieved. Clips were                         placed. Tattooed.                        -  Diverticulosis in the sigmoid colon.                        - Internal hemorrhoids.                        - The examination was otherwise normal on direct and                         retroflexion views.                        - Mucosal resection was performed. Resection and                         retrieval were complete. Recommendation:        - Discharge patient to home.                        - Resume previous diet.                        - Continue present medications.                        - Await pathology results.                        - Repeat colonoscopy for surveillance based on                         pathology results.                        -  Return to referring physician as previously                         scheduled. Procedure Code(s):     --- Professional ---                        [phone], Colonoscopy, flexible; with endoscopic mucosal                         resection Diagnosis Code(s):     --- Professional ---                        S14.961, Personal history of other malignant neoplasm                         of large intestine                        D12.2, Benign neoplasm of ascending colon                        K64.0, First degree hemorrhoids                        K57.30, Diverticulosis of large intestine without                         perforation or abscess without bleeding CPT copyright 2022 American Medical  Association. All rights reserved. The codes documented in this report are preliminary and upon coder review may  be revised to meet current compliance requirements. Ole Schick MD, MD 08/07/2024 12:44:31 PM Number of Addenda: 0 Note Initiated On: 08/07/2024 11:53 AM Scope Withdrawal Time: 0 hours 21 minutes 45 seconds  Total Procedure Duration: 0 hours 33 minutes 58 seconds  Estimated Blood Loss:  Estimated blood loss was minimal.      [facility]

## 2024-08-07 NOTE — H&P (Signed)
 Outpatient short stay form Pre-procedure 08/07/2024  Annette ONEIDA Schick, MD  Primary Physician: Rudolpho Norleen BIRCH, MD  Reason for visit:  Personal History of colon cancer  History of present illness:    78 y/o lady with history of obesity, hypertension, arthritis, and psoriasis here for colonoscopy for personal history of colon cancer. Last colonoscopy three years ago with adenomatous polyp. No blood thinners. History of right hemicolectomy and hysterectomy. No other family members with history of colon cancer.    Current Facility-Administered Medications:    0.9 %  sodium chloride  infusion, , Intravenous, Continuous, Dravyn Severs, Annette ONEIDA, MD, Last Rate: 20 mL/hr at 08/07/24 1147, New Bag at 08/07/24 1147  Medications Prior to Admission  Medication Sig Dispense Refill Last Dose/Taking   aspirin  EC 81 MG tablet Take 81 mg by mouth daily. Swallow whole.   08/06/2024   cyclobenzaprine  (FLEXERIL ) 10 MG tablet Take 10 mg by mouth 3 (three) times daily as needed for muscle spasms.   08/06/2024   DULoxetine  (CYMBALTA ) 60 MG capsule Take 60 mg by mouth daily.   08/06/2024   ferrous sulfate 325 (65 FE) MG tablet Take 325 mg by mouth. Take with 4 oz of orange juice -takes once q3days   Past Week   gabapentin (NEURONTIN) 100 MG capsule Take 100 mg by mouth 3 (three) times daily.   08/06/2024   hydrochlorothiazide  (HYDRODIURIL ) 25 MG tablet Take 1 tablet (25 mg total) by mouth every morning. 1 tablet 1 08/06/2024   lisinopril  (ZESTRIL ) 20 MG tablet Take 1 tablet (20 mg total) by mouth daily. 1 tablet 1 08/07/2024 at  8:00 AM   Multiple Vitamin (MULTIVITAMIN) tablet Take 1 tablet by mouth daily.   Past Week   oxyCODONE -acetaminophen  (PERCOCET) 10-325 MG tablet Take 1 tablet by mouth 4 (four) times daily.   08/06/2024   pantoprazole  (PROTONIX ) 40 MG tablet Take 40 mg by mouth daily before breakfast.   08/06/2024   diphenhydrAMINE  (BENADRYL ) 25 MG tablet Take 25 mg by mouth daily as needed for allergies.       docusate sodium  (COLACE) 100 MG capsule Take 100 mg by mouth 2 (two) times daily.       hydroxypropyl methylcellulose (ISOPTO TEARS) 2.5 % ophthalmic solution Place 2 drops into both eyes 4 (four) times daily as needed (for dry eyes).         Allergies  Allergen Reactions   Egg Protein-Containing Drug Products Other (See Comments)    Pt. States egg yoke causes abdominal discomfort if she eats fresh eggs   Tape Swelling    Blisters, paper tape is ok     Past Medical History:  Diagnosis Date   Arthritis    multiple areas   Blood transfusion    74- post childbirth & /w joint replacement    Chronic kidney disease    renal calculi /w pregnancy   Colon cancer, ascending (HCC) 06/01/2020   6 cm, T3,NO, low risk MSI.   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    Physical Exam  Gen: Alert, oriented. Appears stated age.  HEENT: PERRLA. Lungs: No respiratory distress CV: RRR Abd: soft, benign, no masses Ext: No edema    Planned procedures: Proceed with colonoscopy. The patient understands the nature of the planned procedure, indications, risks, alternatives and potential complications including but not limited to bleeding, infection, perforation, damage to internal organs and possible oversedation/side effects from anesthesia. The patient agrees and gives consent to proceed.  Please refer to procedure notes for  findings, recommendations and patient disposition/instructions.     Annette ONEIDA Schick, MD [facility]

## 2024-08-07 NOTE — Anesthesia Preprocedure Evaluation (Signed)
 Anesthesia Evaluation  Patient identified by MRN, date of birth, ID band Patient awake    Reviewed: Allergy & Precautions, H&P , NPO status , Patient's Chart, lab work & pertinent test results, reviewed documented beta blocker date and time   History of Anesthesia Complications (+) Family history of anesthesia reaction and history of anesthetic complications  Airway Mallampati: II   Neck ROM: full    Dental  (+) Poor Dentition   Pulmonary neg pulmonary ROS, former smoker   Pulmonary exam normal        Cardiovascular Exercise Tolerance: Poor hypertension, On Medications Normal cardiovascular exam+ Valvular Problems/Murmurs  Rhythm:regular Rate:Normal     Neuro/Psych  Neuromuscular disease CVA, Residual Symptoms  negative psych ROS   GI/Hepatic Neg liver ROS,GERD  Medicated,,  Endo/Other    Class 3 obesity  Renal/GU CRFRenal disease  negative genitourinary   Musculoskeletal   Abdominal   Peds  Hematology negative hematology ROS (+)   Anesthesia Other Findings Past Medical History: No date: Arthritis     Comment:  multiple areas No date: Blood transfusion     Comment:  2- post childbirth & /w joint replacement  No date: Chronic kidney disease     Comment:  renal calculi /w pregnancy 06/01/2020: Colon cancer, ascending (HCC)     Comment:  6 cm, T3,NO, low risk MSI. No date: Complication of anesthesia     Comment:  post anesthesia - swelling of lip- relative to tape 10/06/2021: COVID-19 No date: Family history of adverse reaction to anesthesia     Comment:  sister- slow to wake up and  has died once   No date: Fibromyalgia No date: GERD (gastroesophageal reflux disease) No date: Heart murmur     Comment:  rec's antibiotic prior to dental work, never had any               cardiac testing done No date: History of kidney stones No date: Hypertension No date: Neuromuscular disorder (HCC)     Comment:  DDD-  lumbar & cervical  No date: Psoriasis No date: Seasonal allergies No date: Stroke [facility])     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, 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- some weakness remains  Past Surgical History: No date: ABDOMINAL HYSTERECTOMY No date: APPENDECTOMY No date: CERVICAL FUSION     Comment:  2011 No date: CESAREAN SECTION     Comment:  2 times  No date: COLON SURGERY 05/07/2020: COLONOSCOPY WITH PROPOFOL ; N/A     Comment:  Procedure: COLONOSCOPY WITH PROPOFOL ;  Surgeon: Dessa Reyes ORN, MD;  Location: [facility] ENDOSCOPY;  Service:               Endoscopy;  Laterality: N/A; 05/27/2021: COLONOSCOPY WITH PROPOFOL ; N/A     Comment:  Procedure: COLONOSCOPY WITH PROPOFOL ;  Surgeon: Dessa Reyes ORN, MD;  Location: [facility] ENDOSCOPY;  Service:               Endoscopy;  Laterality: N/A; No date: CYST EXCISION; Right     Comment:  hand, and both ovaries 05/07/2020: ESOPHAGOGASTRODUODENOSCOPY; N/A     Comment:  Procedure: ESOPHAGOGASTRODUODENOSCOPY (EGD);  Surgeon:               Dessa Reyes ORN, MD;  Location: [facility] ENDOSCOPY;  Service: Endoscopy;  Laterality: N/A; No date: EYE SURGERY     Comment:  bilateral cataract surgery  No date: JOINT REPLACEMENT     Comment:  1999- R knee 05/26/2020: LAPAROSCOPIC RIGHT COLECTOMY; Right     Comment:  Procedure: LAPAROSCOPIC RIGHT HEMI-COLECTOMY;  Surgeon:               Dessa Reyes ORN, MD;  Location: [facility] ORS;  Service:               General;  Laterality: Right; 09/15/2011: LUMBAR WOUND DEBRIDEMENT     Comment:  Procedure: LUMBAR WOUND DEBRIDEMENT;  Surgeon: Donaciano JONETTA Sprang;  Location: [facility] OR;  Service: Orthopedics;                Laterality: N/A;  REPEAT I&D AND WOUND CLOSURE OF SPINAL               WOUND No date: OVARIAN CYST REMOVAL 02/07/2013: SPINAL CORD STIMULATOR BATTERY EXCHANGE; Left     Comment:  Procedure: REMOVAL OF HARDWARE (BATTERY REMOVAL);                Surgeon: Donaciano Sprang, MD;   Location: [facility] OR;  Service:               Orthopedics;  Laterality: Left;  removal of spinal cord               stimulator battery 08/25/2011: SPINAL CORD STIMULATOR INSERTION     Comment:  Procedure: LUMBAR SPINAL CORD STIMULATOR INSERTION;                Surgeon: Donaciano JONETTA Sprang;  Location: [facility] OR;  Service:               Orthopedics;  Laterality: N/A; No date: TONSILLECTOMY     Comment:  as a child 01/21/2016: TOTAL KNEE REVISION; Right     Comment:  Procedure: RIGHT TOTAL KNEE REVISION;  Surgeon: Dempsey Moan, MD;  Location: [facility] ORS;  Service: Orthopedics;                Laterality: Right; No date: TRIGGER FINGER RELEASE; Right     Comment:  thumb BMI    Body Mass Index: 40.36 kg/m     Reproductive/Obstetrics negative OB ROS                              Anesthesia Physical Anesthesia Plan  ASA: 3  Anesthesia Plan: General   Post-op Pain Management:    Induction:   PONV Risk Score and Plan:   Airway Management Planned:   Additional Equipment:   Intra-op Plan:   Post-operative Plan:   Informed Consent: I have reviewed the patients History and Physical, chart, labs and discussed the procedure including the risks, benefits and alternatives for the proposed anesthesia with the patient or authorized representative who has indicated his/her understanding and acceptance.     Dental Advisory Given  Plan Discussed with: CRNA  Anesthesia Plan Comments:         Anesthesia Quick Evaluation

## 2024-08-07 NOTE — Interval H&P Note (Signed)
 History and Physical Interval Note:  08/07/2024 11:54 AM  Annette Mccullough  has presented today for surgery, with the diagnosis of HX OF COLON CANCER.  The various methods of treatment have been discussed with the patient and family. After consideration of risks, benefits and other options for treatment, the patient has consented to  Procedure(s): COLONOSCOPY (N/A) as a surgical intervention.  The patient's history has been reviewed, patient examined, no change in status, stable for surgery.  I have reviewed the patient's chart and labs.  Questions were answered to the patient's satisfaction.     Ole ONEIDA Schick  Ok to proceed with colonoscopy

## 2024-08-07 NOTE — Transfer of Care (Signed)
 Immediate Anesthesia Transfer of Care Note  Patient: Annette Mccullough  Procedure(s) Performed: COLONOSCOPY POLYPECTOMY, INTESTINE CONTROL OF HEMORRHAGE, GI TRACT, ENDOSCOPIC, BY CLIPPING OR OVERSEWING INSERTION, FIDUCIAL MARKERS  Patient Location: PACU  Anesthesia Type:General  Level of Consciousness: sedated  Airway & Oxygen Therapy: Patient Spontanous Breathing  Post-op Assessment: Report given to RN and Post -op Vital signs reviewed and stable  Post vital signs: Reviewed and stable  Last Vitals:  Vitals Value Taken Time  BP 159/84 08/07/24 12:39  Temp 35.6 C 08/07/24 12:39  Pulse 70 08/07/24 12:40  Resp 20 08/07/24 12:40  SpO2 100 % 08/07/24 12:40    Last Pain:  Vitals:   08/07/24 1239  TempSrc: Temporal  PainSc: 0-No pain         Complications: No notable events documented.

## 2024-08-08 ENCOUNTER — Other Ambulatory Visit: Payer: Self-pay | Admitting: Medical Genetics

## 2024-08-08 LAB — SURGICAL PATHOLOGY

## 2024-08-09 ENCOUNTER — Encounter: Payer: Self-pay | Admitting: Gastroenterology

## 2024-08-09 NOTE — Anesthesia Postprocedure Evaluation (Signed)
 Anesthesia Post Note  Patient: EH SAUSEDA  Procedure(s) Performed: COLONOSCOPY POLYPECTOMY, INTESTINE CONTROL OF HEMORRHAGE, GI TRACT, ENDOSCOPIC, BY CLIPPING OR OVERSEWING INSERTION, FIDUCIAL MARKERS  Patient location during evaluation: PACU Anesthesia Type: General Level of consciousness: awake and alert Pain management: pain level controlled Vital Signs Assessment: post-procedure vital signs reviewed and stable Respiratory status: spontaneous breathing, nonlabored ventilation, respiratory function stable and patient connected to nasal cannula oxygen Cardiovascular status: blood pressure returned to baseline and stable Postop Assessment: no apparent nausea or vomiting Anesthetic complications: no   No notable events documented.   Last Vitals:  Vitals:   08/07/24 1249 08/07/24 1259  BP: (!) 141/76 (!) 155/106  Pulse: 76 74  Resp: 19 19  Temp:    SpO2: 100% 100%    Last Pain:  Vitals:   08/08/24 0821  TempSrc:   PainSc: 0-No pain                 Lynwood KANDICE Clause

## 2024-08-22 ENCOUNTER — Ambulatory Visit: Admitting: Urology

## 2024-08-27 ENCOUNTER — Encounter: Payer: Self-pay | Admitting: *Deleted

## 2024-08-27 ENCOUNTER — Ambulatory Visit: Admitting: Urology

## 2024-11-08 ENCOUNTER — Other Ambulatory Visit: Payer: Self-pay | Admitting: Medical Genetics

## 2024-11-08 DIAGNOSIS — Z006 Encounter for examination for normal comparison and control in clinical research program: Secondary | ICD-10-CM
